# Patient Record
Sex: Female | Born: 1935 | Race: White | Hispanic: No | Marital: Married | Smoking: Former smoker
Health system: Southern US, Community
[De-identification: ages and names within clinical notes are randomized; demographics above are authoritative.]

## PROBLEM LIST (undated history)

## (undated) ENCOUNTER — Ambulatory Visit (HOSPITAL_COMMUNITY): Payer: Medicare Other

## (undated) DIAGNOSIS — I10 Essential (primary) hypertension: Secondary | ICD-10-CM

## (undated) DIAGNOSIS — F32A Depression, unspecified: Secondary | ICD-10-CM

## (undated) DIAGNOSIS — F0390 Unspecified dementia without behavioral disturbance: Secondary | ICD-10-CM

## (undated) DIAGNOSIS — J449 Chronic obstructive pulmonary disease, unspecified: Secondary | ICD-10-CM

## (undated) DIAGNOSIS — F329 Major depressive disorder, single episode, unspecified: Secondary | ICD-10-CM

## (undated) DIAGNOSIS — E079 Disorder of thyroid, unspecified: Secondary | ICD-10-CM

## (undated) HISTORY — DX: Chronic obstructive pulmonary disease, unspecified: J44.9

## (undated) HISTORY — DX: Major depressive disorder, single episode, unspecified: F32.9

## (undated) HISTORY — DX: Depression, unspecified: F32.A

## (undated) HISTORY — DX: Unspecified dementia, unspecified severity, without behavioral disturbance, psychotic disturbance, mood disturbance, and anxiety: F03.90

## (undated) HISTORY — DX: Disorder of thyroid, unspecified: E07.9

## (undated) HISTORY — DX: Essential (primary) hypertension: I10

## (undated) HISTORY — PX: LUMBAR DISC SURGERY: SHX700

## (undated) HISTORY — PX: ROTATOR CUFF REPAIR: SHX139

## (undated) HISTORY — PX: CATARACT EXTRACTION: SUR2

---

## 2004-02-26 ENCOUNTER — Emergency Department: Payer: Self-pay | Admitting: General Practice

## 2004-07-29 ENCOUNTER — Ambulatory Visit: Payer: Self-pay | Admitting: Orthopaedic Surgery

## 2004-09-27 ENCOUNTER — Other Ambulatory Visit: Payer: Self-pay

## 2004-09-27 ENCOUNTER — Ambulatory Visit: Payer: Self-pay | Admitting: Orthopaedic Surgery

## 2004-10-07 ENCOUNTER — Ambulatory Visit: Payer: Self-pay | Admitting: Orthopaedic Surgery

## 2005-07-15 ENCOUNTER — Ambulatory Visit: Payer: Self-pay | Admitting: Unknown Physician Specialty

## 2005-07-24 ENCOUNTER — Ambulatory Visit: Payer: Self-pay | Admitting: Orthopaedic Surgery

## 2005-07-25 ENCOUNTER — Ambulatory Visit: Payer: Self-pay | Admitting: Orthopaedic Surgery

## 2006-07-16 ENCOUNTER — Ambulatory Visit: Payer: Self-pay | Admitting: Internal Medicine

## 2006-11-13 ENCOUNTER — Other Ambulatory Visit: Payer: Self-pay

## 2006-11-13 ENCOUNTER — Inpatient Hospital Stay: Payer: Self-pay | Admitting: Surgery

## 2008-02-03 ENCOUNTER — Ambulatory Visit: Payer: Self-pay | Admitting: Neurology

## 2008-11-06 ENCOUNTER — Ambulatory Visit: Payer: Self-pay | Admitting: Gastroenterology

## 2009-03-29 ENCOUNTER — Ambulatory Visit: Payer: Self-pay | Admitting: Internal Medicine

## 2009-11-23 ENCOUNTER — Ambulatory Visit: Payer: Self-pay | Admitting: Internal Medicine

## 2010-04-01 ENCOUNTER — Emergency Department: Payer: Self-pay | Admitting: Emergency Medicine

## 2010-04-19 ENCOUNTER — Other Ambulatory Visit (HOSPITAL_COMMUNITY): Payer: Self-pay | Admitting: Neurosurgery

## 2010-04-19 ENCOUNTER — Ambulatory Visit (HOSPITAL_COMMUNITY)
Admission: RE | Admit: 2010-04-19 | Discharge: 2010-04-19 | Disposition: A | Discharge status: Home / Self Care | Payer: Medicare Other | Source: Ambulatory Visit | Attending: Neurosurgery | Admitting: Neurosurgery

## 2010-04-19 ENCOUNTER — Encounter (HOSPITAL_COMMUNITY)
Admission: RE | Admit: 2010-04-19 | Discharge: 2010-04-19 | Disposition: A | Discharge status: Home / Self Care | Payer: Medicare Other | Source: Ambulatory Visit | Attending: Neurosurgery | Admitting: Neurosurgery

## 2010-04-19 DIAGNOSIS — Z01812 Encounter for preprocedural laboratory examination: Secondary | ICD-10-CM | POA: Insufficient documentation

## 2010-04-19 DIAGNOSIS — Z01818 Encounter for other preprocedural examination: Secondary | ICD-10-CM | POA: Insufficient documentation

## 2010-04-19 DIAGNOSIS — M5136 Other intervertebral disc degeneration, lumbar region: Secondary | ICD-10-CM

## 2010-04-19 DIAGNOSIS — M47814 Spondylosis without myelopathy or radiculopathy, thoracic region: Secondary | ICD-10-CM | POA: Insufficient documentation

## 2010-04-19 DIAGNOSIS — M5137 Other intervertebral disc degeneration, lumbosacral region: Secondary | ICD-10-CM | POA: Insufficient documentation

## 2010-04-19 DIAGNOSIS — Z01810 Encounter for preprocedural cardiovascular examination: Secondary | ICD-10-CM | POA: Insufficient documentation

## 2010-04-19 DIAGNOSIS — M51379 Other intervertebral disc degeneration, lumbosacral region without mention of lumbar back pain or lower extremity pain: Secondary | ICD-10-CM | POA: Insufficient documentation

## 2010-04-19 LAB — BASIC METABOLIC PANEL
BUN: 18 mg/dL (ref 6–23)
CO2: 26 mEq/L (ref 19–32)
Chloride: 99 mEq/L (ref 96–112)
Creatinine, Ser: 0.99 mg/dL (ref 0.4–1.2)
Glucose, Bld: 104 mg/dL — ABNORMAL HIGH (ref 70–99)
Potassium: 4.2 mEq/L (ref 3.5–5.1)

## 2010-04-19 LAB — URINALYSIS, ROUTINE W REFLEX MICROSCOPIC
Glucose, UA: NEGATIVE mg/dL
Nitrite: NEGATIVE
Specific Gravity, Urine: 1.022 (ref 1.005–1.030)
pH: 5 (ref 5.0–8.0)

## 2010-04-19 LAB — SURGICAL PCR SCREEN: Staphylococcus aureus: NEGATIVE

## 2010-04-19 LAB — URINE MICROSCOPIC-ADD ON

## 2010-04-19 LAB — APTT: aPTT: 24 seconds (ref 24–37)

## 2010-04-19 LAB — CBC
HCT: 44.9 % (ref 36.0–46.0)
MCH: 30.6 pg (ref 26.0–34.0)
MCV: 89.1 fL (ref 78.0–100.0)
Platelets: 252 10*3/uL (ref 150–400)
RDW: 14.7 % (ref 11.5–15.5)
WBC: 14.7 10*3/uL — ABNORMAL HIGH (ref 4.0–10.5)

## 2010-04-22 ENCOUNTER — Inpatient Hospital Stay (HOSPITAL_COMMUNITY): Payer: Medicare Other

## 2010-04-22 ENCOUNTER — Inpatient Hospital Stay (HOSPITAL_COMMUNITY)
Admission: RE | Admit: 2010-04-22 | Discharge: 2010-04-23 | DRG: 491 | Disposition: A | Discharge status: Home / Self Care | Payer: Medicare Other | Source: Ambulatory Visit | Attending: Neurosurgery | Admitting: Neurosurgery

## 2010-04-22 ENCOUNTER — Other Ambulatory Visit: Payer: Self-pay | Admitting: Neurosurgery

## 2010-04-22 DIAGNOSIS — M47817 Spondylosis without myelopathy or radiculopathy, lumbosacral region: Principal | ICD-10-CM | POA: Diagnosis present

## 2010-04-22 DIAGNOSIS — I10 Essential (primary) hypertension: Secondary | ICD-10-CM | POA: Diagnosis present

## 2010-04-22 DIAGNOSIS — M1710 Unilateral primary osteoarthritis, unspecified knee: Secondary | ICD-10-CM | POA: Diagnosis present

## 2010-04-22 DIAGNOSIS — M7130 Other bursal cyst, unspecified site: Secondary | ICD-10-CM | POA: Diagnosis present

## 2010-04-26 NOTE — Op Note (Signed)
NAME:  Belinda Gonzalez, Belinda Gonzalez               ACCOUNT NO.:  [id number]  MEDICAL RECORD NO.:  [id number]           PATIENT TYPE:  I  LOCATION:  3534                         FACILITY:  [facility]  PHYSICIAN:  Clydene Fake, M.D.  DATE OF BIRTH:  07/13/1934  DATE OF PROCEDURE: DATE OF DISCHARGE:                              OPERATIVE REPORT   PREOPERATIVE DIAGNOSIS:  Lumbar stenosis, spondylosis, right-sided radiculopathy, possible synovial cyst.  POSTOPERATIVE DIAGNOSIS:  Lumbar stenosis, spondylosis, right-sided radiculopathy, possible synovial cyst.  PROCEDURE:  Right decompressive laminectomy decompressing the L3, L4, L5 roots (three levels), microdissection with microscope.  SURGEON:  Clydene Fake, MD  ASSISTANT:  Clydene Fake, MD  General endotracheal tube anesthesia.  ESTIMATED BLOOD LOSS:  Minimal.  BLOOD GIVEN:  None.  DRAINS:  None.  COMPLICATIONS:  None.  PATHOLOGY:  __________ was sent for permanent section.  PROCEDURE:  The patient is a 76 year old woman who has had back and right leg pain radiating to the right hip and then to her anterior thigh, and sometimes the shin area.  Epidural injection was done which gave a day or two of relief but then symptoms came back.  MRI prior to that showed multilevel spondylitic change of the lumbar spine and facet hypertrophy and which was worse on the right side at 3-4 and 4-5 with lateral recess stenosis at those two levels, probable synovial cyst at the 3-4 level.  The patient was brought in for decompression of L3 through 5.  PROCEDURE IN DETAIL:  The patient was brought into the operating room, general anesthesia was induced.  The patient was placed in a prone position on Wilson frame and all pressure points were padded and the patient was prepared and draped in sterile fashion.  A needle was placed in the interspace.  X-rays were obtained showing the needle was pointed towards the 4-5 interspace.  We injected the area  of incision with 20 mL of 1% lidocaine with epinephrine.  Incision was then made over the midline in the lower lumbar spine.  Incision was taken down to the fascia.  Hemostasis was obtained with Bovie cauterization, and the right- side subperiosteal dissection was done over the spinous process of lamina at the two levels.  Markers were placed at two spaces and x-rays were obtained showing the markers were at the 2-3 and 3-4 levels, so we dissected down the 4-5 level, moved our retractors down exposing the 3-4 and 4-5 levels.  I placed markers at the interspaces of 2-3, 3-4, and 4- 5.  I took another x-ray and this confirmed our positioning.  Using a high-speed drill, I started decompressing the right-sided hemilaminectomy removing the bottom of L4 and the top of L5 spinous lamina and medial facets.  Microscope was brought in for microdissection.  Kerrison punches were used to continue the decompression.  There was significant thickened ligament at the 3-4 level.  We did find epidural mass that was attached to the dura. Careful microdissection was used to try to mobilize this and couple spots was very adherent to the dura and so we ended up opening up cyst, insides of  the dark material, and pieces of this and pieces of the wall were sent to pathology for permanent sections.  We removed most of the cyst and there was other areas of adherent ligament into the dura and we carefully dissected that off.  Foraminotomy done over the L3, L4, L5 roots.  When we had finished, we had good decompression and good hemilaminectomy from L3 to 5.  We did Valsalva.  No sign of CSF leak. We irrigated with antibiotic solution.  We had very good hemostasis. Retractors were removed, and the fascia closed with 0 Vicryl interrupted sutures, subcutaneous tissue closed with 2-0 and 3-0 Vicryl interrupted sutures, skin closed with benzoin and Steri-Strips.  Dressing was placed.  The patient was placed back in the  supine position, awoken from anesthesia, and transferred to recovery room in stable condition.          ______________________________ Clydene Fake, M.D.     JRH/MEDQ  D:  04/22/2010  T:  04/23/2010  Job:  [id number]  Electronically Signed by Colon Branch M.D. on 04/26/2010 04:44:44 PM

## 2010-07-19 ENCOUNTER — Ambulatory Visit: Payer: Self-pay | Admitting: Internal Medicine

## 2010-08-12 ENCOUNTER — Emergency Department: Payer: Self-pay | Admitting: *Deleted

## 2010-08-18 ENCOUNTER — Ambulatory Visit: Payer: Self-pay | Admitting: Family Medicine

## 2011-04-24 ENCOUNTER — Ambulatory Visit: Payer: Self-pay | Admitting: Physical Medicine and Rehabilitation

## 2012-03-01 LAB — CBC
HCT: 38.4 % (ref 35.0–47.0)
HGB: 12.7 g/dL (ref 12.0–16.0)
MCHC: 33.1 g/dL (ref 32.0–36.0)
RBC: 4.26 10*6/uL (ref 3.80–5.20)
WBC: 9.8 10*3/uL (ref 3.6–11.0)

## 2012-03-01 LAB — URINALYSIS, COMPLETE
Bacteria: NONE SEEN
Bilirubin,UR: NEGATIVE
Protein: NEGATIVE
WBC UR: 8 /HPF (ref 0–5)

## 2012-03-01 LAB — COMPREHENSIVE METABOLIC PANEL
EGFR (Non-African Amer.): 53 — ABNORMAL LOW
Glucose: 99 mg/dL (ref 65–99)
Osmolality: 274 (ref 275–301)
Sodium: 137 mmol/L (ref 136–145)
Total Protein: 7.2 g/dL (ref 6.4–8.2)

## 2012-03-01 LAB — TROPONIN I: Troponin-I: <0.02 ng/mL

## 2012-03-02 ENCOUNTER — Inpatient Hospital Stay: Payer: Self-pay | Admitting: Internal Medicine

## 2012-03-02 LAB — SEDIMENTATION RATE: Erythrocyte Sed Rate: 13 mm/hr (ref 0–30)

## 2012-03-02 LAB — TSH: Thyroid Stimulating Horm: 2.54 u[IU]/mL

## 2012-03-03 LAB — URINE CULTURE

## 2012-05-20 ENCOUNTER — Ambulatory Visit: Payer: Self-pay | Admitting: Family Medicine

## 2012-08-21 ENCOUNTER — Ambulatory Visit: Payer: Self-pay | Admitting: Psychiatry

## 2012-08-21 LAB — CREATININE, SERUM
Creatinine: 0.8 mg/dL (ref 0.60–1.30)
EGFR (African American): >60

## 2013-01-31 ENCOUNTER — Ambulatory Visit: Payer: Self-pay | Admitting: Gastroenterology

## 2013-02-04 ENCOUNTER — Ambulatory Visit: Payer: Self-pay | Admitting: Gastroenterology

## 2013-02-09 LAB — PATHOLOGY REPORT

## 2013-06-28 ENCOUNTER — Ambulatory Visit: Payer: Self-pay | Admitting: Family Medicine

## 2013-10-17 DIAGNOSIS — M48061 Spinal stenosis, lumbar region without neurogenic claudication: Secondary | ICD-10-CM | POA: Diagnosis present

## 2014-01-03 ENCOUNTER — Ambulatory Visit: Payer: Self-pay | Admitting: Physical Medicine and Rehabilitation

## 2014-04-28 NOTE — H&P (Signed)
PATIENT NAME:  Belinda Gonzalez, Belinda Gonzalez MR#:  [id number] DATE OF BIRTH:  08-05-1934  DATE OF ADMISSION:  03/02/2012  PRIMARY CARE PHYSICIAN:  Dr. Burnadette PopLinthavong.   CHIEF COMPLAINT:  Acute onset of confusion.   HISTORY OF PRESENTING ILLNESS:  A 80 year old Caucasian female patient with a history of hypertension, dementia, presented to the Emergency Room, brought in by her husband with acute worsening of confusion. The patient did not know where she was or recognized the  people around, and she was inappropriately dressed. In the Emergency Room the patient is not oriented to place or time; is oriented to person. Has fever of 100.4 with a UA normal. Chest x-ray showing no infiltrate. No rash. No clear source of infection. An LP was attempted in the Emergency Room; was not successful.   Husband mentioned that the patient has had a slow decline of her mentation over the past few months in a stepwise pattern.   PAST MEDICAL HISTORY:  Of hypothyroidism, hyperlipidemia, hypertension, depression, COPD.  PAST SURGICAL HISTORY:   Cesarean section.   HOME MEDICATIONS: 1. Synthroid 100 mcg oral daily.  2. Remeron 45 mg oral daily.  3. Lisinopril 20 mg oral 2 times a day.  4. Crestor 10 mg oral daily.  5. Tramadol 50 mg oral every 6 hours as needed for pain.  6. Duloxetine 30 mg oral daily.  7. Gabapentin 300 mg oral daily.   8. Vitamin D2 at 50,000 international units oral once a week.   ALLERGIES: No known drug allergies.   SOCIAL HISTORY:  The patient quit smoking 2 years back. No alcohol. Is married. Three children, and lives with her husband.   FAMILY HISTORY:  Positive for diabetes in her father. Cancer in 2 grandfathers. Also positive for lung and stomach cancers.   Review of systems unobtainable as patient is confused.   PHYSICAL EXAMINATION: VITAL SIGNS:  Shows temperature 100.4; presently down to 98.2, pulse of 74, respirations 18, blood pressure 123/71, saturating 95% on room air.  GENERAL:   Elderly Caucasian female patient, lying in bed, comfortable, conversational.  PSYCHIATRIC:  Alert and awake, but not oriented to place or time. Oriented to person. Confused.   HEENT:  Atraumatic, normocephalic. Oral mucosa dry and pink. External ears and nose normal. No pallor. No icterus. Pupils were bilaterally equal and reactive to light.  NECK:  Supple. No thyromegaly. No palpable lymph nodes. Trachea midline. No carotid bruit, JVD.  CARDIOVASCULAR:  S1, S2, regular rate and rhythm. No edema. Peripheral pulses 2+.  RESPIRATORY:  Normal work of breathing. Clear to auscultation on both sides.  GASTROINTESTINAL:  Soft abdomen, nontender. Bowel sounds present. No hepatosplenomegaly palpable.  SKIN:  Warm and dry. No petechiae or cyanosis. MUSCULOSKELETAL:  No joint swelling, redness, or effusion of the large joints. Normal muscle tone.   NEUROLOGICAL:  Motor strength 5/5 in upper and lower extremities. Sensation to fine touch intact all over. No neck rigidity. Kernig sign negative.   LAB STUDIES:  Show glucose 99, BUN 12, creatinine 1.04. Troponin less than 0.02. TSH of 2.54. WBC 9.8, hemoglobin 12.7.   Urinalysis shows 8 WBC and no bacteria.   CT scan of the head shows no acute changes.   Chest x-ray shows no acute infiltrate.   ASSESSMENT AND PLAN: 1. Acute encephalopathy, with fever:  There is no clear source of the fever at this time. Urinalysis shows no urinary tract infection, and chest x-ray shows no infiltrate. A lumbar puncture was attempted in the Emergency Room  without any neck rigidity. Elevated WBC. Meningoencephalitis would be unlikely. Will start the patient on ceftriaxone at this time; await culture results. If the patient does spike a fever will need a lumbar puncture with interventional radiology, and antibiotics will be broadened.  2. Dementia:  From what her husband mentions this patient seems to have a vascular dementia, with a stepwise decline over the past few months.  Presently no focal neurological deficits other than the confusion. CT scan of the head shows no acute stroke. The patient is on aspirin and statin.  3. Hypertension, well-controlled:  Continue medications.  4. Deep vein thrombosis prophylaxis with heparin.   CODE STATUS:  FULL CODE.   Time spent today on this case was greater than 75 minutes, with more than 50% spent in coordination of care.    ____________________________ Belinda Gonzalez. Belinda Housholder, MD srs:dm D: 03/02/2012 04:58:00 ET Gonzalez: 03/02/2012 09:18:38 ET JOB#: [id number]  cc: Belinda Ivan, MD Belinda Gonzalez R. Belinda Jerde, MD, <Dictator>  Orie Fisherman MD ELECTRONICALLY SIGNED 03/06/2012 15:25

## 2015-09-20 ENCOUNTER — Ambulatory Visit: Payer: Medicare Other | Attending: Neurology | Admitting: Speech Pathology

## 2015-09-20 DIAGNOSIS — R41841 Cognitive communication deficit: Secondary | ICD-10-CM | POA: Diagnosis present

## 2015-09-21 ENCOUNTER — Encounter: Payer: Self-pay | Admitting: Speech Pathology

## 2015-09-21 NOTE — Therapy (Signed)
[place] [facility] [address] [place], [place], [address] Phone: [phone]   Fax:  [phone]  Speech Language Pathology Evaluation  Patient Details  Name: Belinda SkeetersClara T Zucco MRN: [id number] Date of Birth: [birth year] Referring Provider: Dr. Malvin JohnsPotter  Encounter Date: 09/20/2015      End of Session - 09/21/15 1100    Visit Number 1   Number of Visits 9   Date for SLP Re-Evaluation 10/26/15   SLP Start Time 1300   SLP Stop Time  1410   SLP Time Calculation (min) 70 min   Activity Tolerance Patient tolerated treatment well      Past Medical History:  Diagnosis Date  . COPD (chronic obstructive pulmonary disease) (HCC)   . Depression   . Hypertension   . Thyroid disease     History reviewed. No pertinent surgical history.  There were no vitals filed for this visit.          SLP Evaluation [facility] - 09/21/15 0001      SLP Visit Information   SLP Received On 09/20/15   Referring Provider Dr. Malvin JohnsPotter   Onset Date 08/23/2015   Medical Diagnosis Moderate dementia     Subjective   Subjective The patient and her husband are not certain what speech therapy can offer.     General Information   HPI 81 year old woman with moderate dementia referred for cognitive therapy.  She is accompanied by her husband.     Prior Functional Status   Cognitive/Linguistic Baseline Baseline deficits   Baseline deficit details Moderate dementia     Cognition   Overall Cognitive Status History of cognitive impairments - at baseline     Auditory Comprehension   Overall Auditory Comprehension Appears within functional limits for tasks assessed  For simple/concrete contexts     Reading Comprehension   Reading Status Impaired  Not able to process longer/abstract information     Expression   Primary Mode of Expression Verbal     Verbal Expression   Overall Verbal Expression Appears within functional limits for tasks assessed  Not able  to express longer/abstract information     Written Expression   Dominant Hand Left   Written Expression Within Functional Limits   Overall Writen Expression Functional for concrete      Oral Motor/Sensory Function   Overall Oral Motor/Sensory Function Appears within functional limits for tasks assessed     Motor Speech   Overall Motor Speech Appears within functional limits for tasks assessed     Standardized Assessments   Standardized Assessments  Western Aphasia Battery revised;Other Assessment  Montreal Cognitive Assessment (MOCA)       Western Aphasia Battery- Screening  Spontaneous Speech      Information content   7/10       Fluency    10/10     Comprehension     Yes/No questions   10/10          Sequential Commands  10/10    Repetition    10/10     Naming    Object Naming   10/10       Screening Aphasia Quotient 95/100  Reading    8/10 Writing    8/10 Screening Language Quotient 91/100  Montreal Cognitive Assessment (MOCA) Version: 7.1 Visuospatieal/Executive Alternating trail making       1/1 Visuoconstruction Skills (copy 3-d design) 0/1 Draw a clock     2/3 Naming     3/3  Attention Forward digit span    1/1 Backward digit span    1/1 Vigilance     1/1 Serial 7's     1/3 Language  Verbal Fluency     0/1 Repetition     2/2 Abstraction     0/2 Delayed Recall    0/5 Orientation     1/6 TOTAL      13/30   Dementia Functional Impact Questionnaire  1. Social isolation: The patient is no longer initiating participation in social activities and is actively avoiding activities.  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  The patient does not indicate a desire to increase participation at any level. 2. Dignity/independence: The patient does not indicate concern regarding lack of independence. 3. Remembering names and other important information: The patient and her husband do not feel that this is an issue for them. 4. Schedule management: The husband states he  manages her schedule. 5. Managing medications: The husband states that a granddaughter organizes their medication once a week. 6. Misplacing important items: The patient denies this is a problem. 7. Using a computer, cell phone, or TV remote: The patient is no longer independent in using the TV remote.  Her husband states that he has tried various methods to simplify the task for her. 8. Participation in volunteer work or other meaningful activities: The patient is sleeping most of the day, does not have responsibilities at home, and resists going out to functions.  What we talked about: 1. [facility] for activities or groups that may interest the patient. 2. Try to set a routine vs. letting the patient sleep all day. 3. Increase responsibilities at home- fold laundry, put the dishes away. 4. Try writing a check list for activities that she is not doing well- such as routine/daily bathing. 5. Advertising account planner [facility] for ideas. 6. Return in 2 weeks.             SLP Education - 09/21/15 1059    Education provided Yes   Education Details Role of SLP in cognitive therapy   Person(s) Educated Patient;Spouse   Methods Explanation   Comprehension Verbalized understanding            SLP Long Term Goals - 09/21/15 1103      SLP LONG TERM GOAL #1   Title Patient will identify cognitive barriers and participate in developing functional compensatory strategies.   Time 8   Period Weeks   Status New     SLP LONG TERM GOAL #2   Title Patient will demonstrate functional use of external memory aids with 80% accuracy within structured setting.   Time 8   Period Weeks   Status New     SLP LONG TERM GOAL #3   Title Patient will complete attention and memory strategy activities with 80% accuracy.   Time 8   Period Weeks          Plan - 09/21/15 1101    Clinical Impression Statement This 81 year ole woman with moderate dementia, is presenting with moderate  cognitive-communication deficits characterized by short term memory loss, impaired sustained attention, impaired executive skills, reduced abstract thought, and decreased participation in social or household activities.  The patient demonstrates very strong language skills for using compensatory strategies. The patient was accompanied by her spouse who is her primary caregiver (with some help from a granddaughter).  The patient and her husband participated in discussion regarding current cognitive barriers to independence/participation.  We  generated multiple ideas to implement.  The patient and her husband will return in 2 week to assess efficacy of ideas and continue generating compensatory strategies.   Speech Therapy Frequency 1x /week   Duration Other (comment)  8 weeks   Potential to Achieve Goals Good   Potential Considerations Ability to learn/carryover information;Cooperation/participation level;Previous level of function;Severity of impairments;Family/community support   SLP Home Exercise Plan Patient and spouse given several ideas to implement   Consulted and Agree with Plan of Care Patient;Family member/caregiver   Family Member Consulted Spouse      Patient will benefit from skilled therapeutic intervention in order to improve the following deficits and impairments:   Cognitive communication deficit - Plan: SLP plan of care cert/re-cert      G-Codes - 23-Sep-2015 1105    Functional Assessment Tool Used MOCA, WAB-screeing, clinical judgment   Functional Limitations Attention   Attention Current Status (U9811) At least 40 percent but less than 60 percent impaired, limited or restricted   Attention Goal Status (B1478) At least 20 percent but less than 40 percent impaired, limited or restricted      Problem List There are no active problems to display for this patient.  Dollene Primrose, MS/CCC- SLP  Leandrew Koyanagi 09-23-15, 11:08 AM  [facility] [facility] MAIN [facility] SERVICES [address] [place], [place], [address] Phone: [phone]   Fax:  [phone]  Name: DANESE DORSAINVIL MRN: [id number] Date of Birth: [birth year]

## 2015-09-25 ENCOUNTER — Ambulatory Visit: Payer: Medicare Other | Admitting: Speech Pathology

## 2015-09-27 ENCOUNTER — Encounter: Payer: Medicare Other | Admitting: Speech Pathology

## 2015-10-01 ENCOUNTER — Encounter: Payer: Medicare Other | Admitting: Speech Pathology

## 2015-10-02 ENCOUNTER — Ambulatory Visit: Payer: Medicare Other | Admitting: Speech Pathology

## 2015-10-03 ENCOUNTER — Encounter: Payer: Medicare Other | Admitting: Speech Pathology

## 2015-10-09 ENCOUNTER — Ambulatory Visit: Payer: Medicare Other | Attending: Neurology | Admitting: Speech Pathology

## 2015-10-09 DIAGNOSIS — R41841 Cognitive communication deficit: Secondary | ICD-10-CM | POA: Insufficient documentation

## 2015-10-10 ENCOUNTER — Encounter: Payer: Self-pay | Admitting: Speech Pathology

## 2015-10-10 NOTE — Therapy (Signed)
[facility] [facility] [address] [place], [place], [address] Phone: [phone]   Fax:  [phone]  Speech Language Pathology Treatment  Patient Details  Name: Belinda Gonzalez MRN: [id number] Date of Birth: 1934-04-11 Referring Provider: Dr. Malvin JohnsPotter  Encounter Date: 10/09/2015      End of Session - 10/10/15 1437    Visit Number 2   Number of Visits 9   Date for SLP Re-Evaluation 10/26/15   SLP Start Time 1400   SLP Stop Time  1500   SLP Time Calculation (min) 60 min   Activity Tolerance Patient tolerated treatment well      Past Medical History:  Diagnosis Date  . COPD (chronic obstructive pulmonary disease) (HCC)   . Depression   . Hypertension   . Thyroid disease     History reviewed. No pertinent surgical history.  There were no vitals filed for this visit.      Subjective Assessment - 10/10/15 1436    Subjective "I don't want any responsibilities"  "I haven't been doing anything"    Patient is accompained by: Family member               ADULT SLP TREATMENT - 10/10/15 0001      General Information   Behavior/Cognition Alert;Cooperative;Pleasant mood;Confused;Decreased sustained attention;Other (comment)  Moderate dementia     Treatment Provided   Treatment provided Cognitive-Linquistic     Pain Assessment   Pain Assessment No/denies pain     Cognitive-Linquistic Treatment   Treatment focused on Cognition;Patient/family/caregiver education   Skilled Treatment Cognitive barriers:  the patient is not able to actively participate in identifying cognitive barriers to improved participation and independence.  The patient's husband is able to identify and verbalize barriers, but is not able to problem solve independently.  We discussed contacting [facility] for further available services, such as an adult day care program.     Assessment / Recommendations / Plan   Plan Continue with current plan of care      Progression Toward Goals   Progression toward goals Progressing toward goals          SLP Education - 10/10/15 1436    Education provided Yes   Education Details The patient is not likely to independently engage in activities   Person(s) Educated Patient;Spouse   Methods Explanation   Comprehension Verbalized understanding            SLP Long Term Goals - 09/21/15 1103      SLP LONG TERM GOAL #1   Title Patient will identify cognitive barriers and participate in developing functional compensatory strategies.   Time 8   Period Weeks   Status New     SLP LONG TERM GOAL #2   Title Patient will demonstrate functional use of external memory aids with 80% accuracy within structured setting.   Time 8   Period Weeks   Status New     SLP LONG TERM GOAL #3   Title Patient will complete attention and memory strategy activities with 80% accuracy.   Time 8   Period Weeks          Plan - 10/10/15 1438    Clinical Impression Statement The patient's primary cognitive barrier to participation and independence is lack of personal motivation.  There is probably an element of unwillingness to face her own cognitive decline.  I do not think that the patient can be expected to independently engage in activities that she once  enjoyed.  I have strongly suggested that the patient's family contact [facility] to find programs that are suitable for her needs.  In the meantime, SLP will explore activities that the patient may enjoy and determine the level of support required to participate.   Speech Therapy Frequency 1x /week   Duration Other (comment)   Potential to Achieve Goals Good   Potential Considerations Ability to learn/carryover information;Cooperation/participation level;Previous level of function;Severity of impairments;Family/community support   SLP Home Exercise Plan Recommend contacting [employer]   Consulted and Agree with Plan of Care Patient;Family member/caregiver    Family Member Consulted Spouse      Patient will benefit from skilled therapeutic intervention in order to improve the following deficits and impairments:   Cognitive communication deficit    Problem List There are no active problems to display for this patient.  Dollene Primrose, MS/CCC- SLP  Leandrew Koyanagi 10/10/2015, 2:40 PM  [place] [facility] MAIN [facility] SERVICES [address] [place], [place], [address] Phone: [phone]   Fax:  [phone]   Name: Belinda Gonzalez MRN: [id number] Date of Birth: October 28, 1934

## 2015-10-11 ENCOUNTER — Ambulatory Visit: Payer: Medicare Other | Admitting: Speech Pathology

## 2015-10-16 ENCOUNTER — Ambulatory Visit: Payer: Medicare Other | Admitting: Speech Pathology

## 2015-10-16 DIAGNOSIS — R41841 Cognitive communication deficit: Secondary | ICD-10-CM

## 2015-10-17 ENCOUNTER — Encounter: Payer: Self-pay | Admitting: Speech Pathology

## 2015-10-17 NOTE — Therapy (Signed)
[facility] [address] [place], [place], [address] Phone: [phone]   Fax:  [phone]  Speech Language Pathology Treatment  Patient Details  Name: Belinda SkeetersClara T Gonzalez MRN: [id number] Date of Birth: 01/15/1934 Referring Provider: Dr. Malvin JohnsPotter  Encounter Date: 10/16/2015      End of Session - 10/17/15 1022    Visit Number 3   Number of Visits 9   Date for SLP Re-Evaluation 10/26/15   SLP Start Time 1400   SLP Stop Time  1500   SLP Time Calculation (min) 60 min      Past Medical History:  Diagnosis Date  . COPD (chronic obstructive pulmonary disease) (HCC)   . Depression   . Hypertension   . Thyroid disease     History reviewed. No pertinent surgical history.  There were no vitals filed for this visit.      Subjective Assessment - 10/17/15 1003    Subjective "I don't want any responsibilities"  "I haven't been doing anything"    Patient is accompained by: Family member               ADULT SLP TREATMENT - 10/17/15 0001      General Information   Behavior/Cognition Alert;Cooperative;Pleasant mood;Confused;Decreased sustained attention;Other (comment)  Moderate dementia     Treatment Provided   Treatment provided Cognitive-Linquistic     Pain Assessment   Pain Assessment No/denies pain     Cognitive-Linquistic Treatment   Treatment focused on Cognition;Patient/family/caregiver education   Skilled Treatment Cognitively stimulating activities: The patient and SLP completed a variety of cognitive activities.  The patient requires the most support for visual perceptual activities (find hidden pictures, spot the differences) and least support for simple reading activities.       Assessment / Recommendations / Plan   Plan Continue with current plan of care     Progression Toward Goals   Progression toward goals Progressing toward goals          SLP Education - 10/17/15 1003    Education  provided Yes   Education Details Patient requires support to participate in activities   Person(s) Educated Patient;Spouse   Methods Explanation   Comprehension Verbalized understanding            SLP Long Term Goals - 09/21/15 1103      SLP LONG TERM GOAL #1   Title Patient will identify cognitive barriers and participate in developing functional compensatory strategies.   Time 8   Period Weeks   Status New     SLP LONG TERM GOAL #2   Title Patient will demonstrate functional use of external memory aids with 80% accuracy within structured setting.   Time 8   Period Weeks   Status New     SLP LONG TERM GOAL #3   Title Patient will complete attention and memory strategy activities with 80% accuracy.   Time 8   Period Weeks          Plan - 10/17/15 1023    Clinical Impression Statement The patient's primary cognitive barrier to participation and independence is lack of personal motivation.  There is probably an element of unwillingness to face her own cognitive decline.  I do not think that the patient can be expected to independently engage in activities that she once enjoyed.  The patient and SLP completed a variety of cognitive activities.  The patient requires the most support for visual perceptual activities (find hidden pictures, spot  the differences) and least support for simple reading activities.  I continue to strongly suggest that the patient's family contact [employer] to find programs that are suitable for her needs.  In the meantime, SLP will continue explore activities that the patient may enjoy and determine the level of support required to participate.   Speech Therapy Frequency 1x /week   Duration Other (comment)   Treatment/Interventions Patient/family education;Cognitive reorganization   Potential to Achieve Goals Good   Potential Considerations Ability to learn/carryover information;Cooperation/participation level;Previous level of function;Severity of  impairments;Family/community support   SLP Home Exercise Plan Recommend contacting [employer], patient and spouse given written list of suggested activities   Consulted and Agree with Plan of Care Patient;Family member/caregiver   Family Member Consulted Spouse      Patient will benefit from skilled therapeutic intervention in order to improve the following deficits and impairments:   Cognitive communication deficit    Problem List There are no active problems to display for this patient.  Dollene Primrose, MS/CCC- SLP  Leandrew Koyanagi 10/17/2015, 10:24 AM  [facility] MAIN [facility] SERVICES [address] [place], [place], [address] Phone: [phone]   Fax:  [phone]   Name: Belinda Gonzalez MRN: [id number] Date of Birth: 1934/12/02

## 2015-10-18 ENCOUNTER — Encounter: Payer: Medicare Other | Admitting: Speech Pathology

## 2015-10-26 ENCOUNTER — Encounter: Payer: Self-pay | Admitting: Speech Pathology

## 2015-10-26 ENCOUNTER — Ambulatory Visit: Payer: Medicare Other | Admitting: Speech Pathology

## 2015-10-26 DIAGNOSIS — R41841 Cognitive communication deficit: Secondary | ICD-10-CM | POA: Diagnosis not present

## 2015-10-26 NOTE — Therapy (Signed)
[facility] [address] [place], [place], [address] Phone: [phone]   Fax:  [phone]  Speech Language Pathology Treatment  Patient Details  Name: Belinda SkeetersClara T Gonzalez MRN: [id number] Date of Birth: [birth year] Referring Provider: Dr. Malvin JohnsPotter  Encounter Date: 10/26/2015      End of Session - 10/26/15 1252    Visit Number 4   Number of Visits 9   Date for SLP Re-Evaluation 11/26/15   SLP Start Time 1000   SLP Stop Time  1100   SLP Time Calculation (min) 60 min      Past Medical History:  Diagnosis Date  . COPD (chronic obstructive pulmonary disease) (HCC)   . Depression   . Hypertension   . Thyroid disease     History reviewed. No pertinent surgical history.  There were no vitals filed for this visit.      Subjective Assessment - 10/26/15 1252    Subjective "I don't want any responsibilities"  "I haven't been doing anything"    Patient is accompained by: Family member               ADULT SLP TREATMENT - 10/26/15 0001      General Information   Behavior/Cognition Alert;Cooperative;Pleasant mood;Confused;Decreased sustained attention;Other (comment)  Moderate dementia     Treatment Provided   Treatment provided Cognitive-Linquistic     Pain Assessment   Pain Assessment No/denies pain     Cognitive-Linquistic Treatment   Treatment focused on Cognition;Patient/family/caregiver education   Skilled Treatment Cognitively stimulating activities: The patient and SLP completed a variety of cognitive activities.  Activities included: dot-to-dot, color by number, telling time, stating location of list of items, word search.  The patient requires the most support for visual perceptual activities (find hidden pictures, spot the differences) and least support for simple reading activities.       Assessment / Recommendations / Plan   Plan Continue with current plan of care     Progression Toward Goals   Progression toward goals Progressing toward goals          SLP Education - 10/26/15 1252    Education provided Yes   Education Details Patient requires support to participate in activities   Person(s) Educated Patient;Spouse   Methods Explanation   Comprehension Verbalized understanding            SLP Long Term Goals - 09/21/15 1103      SLP LONG TERM GOAL #1   Title Patient will identify cognitive barriers and participate in developing functional compensatory strategies.   Time 8   Period Weeks   Status New     SLP LONG TERM GOAL #2   Title Patient will demonstrate functional use of external memory aids with 80% accuracy within structured setting.   Time 8   Period Weeks   Status New     SLP LONG TERM GOAL #3   Title Patient will complete attention and memory strategy activities with 80% accuracy.   Time 8   Period Weeks          Plan - 10/26/15 1253    Clinical Impression Statement The patient's primary cognitive barrier to participation and independence is lack of personal motivation.  There is probably an element of unwillingness to face her own cognitive decline.  I do not think that the patient can be expected to independently engage in activities that she once enjoyed.  The patient and SLP completed a variety of cognitive  activities.  The patient requires the most support for visual perceptual activities (find hidden pictures, spot the differences) and least support for simple reading activities.  I continue to strongly suggest that the patient's family contact [employer] to find programs that are suitable for her needs.  In the meantime, SLP will continue explore activities that the patient may enjoy and determine the level of support required to participate.   Speech Therapy Frequency 1x /week   Duration Other (comment)   Treatment/Interventions Patient/family education;Cognitive reorganization   Potential to Achieve Goals Good   Potential Considerations  Ability to learn/carryover information;Cooperation/participation level;Previous level of function;Severity of impairments;Family/community support   SLP Home Exercise Plan Recommend contacting [employer], patient and spouse given written list of suggested activities   Consulted and Agree with Plan of Care Patient;Family member/caregiver   Family Member Consulted Spouse      Patient will benefit from skilled therapeutic intervention in order to improve the following deficits and impairments:   Cognitive communication deficit    Problem List There are no active problems to display for this patient.  Dollene Primrose, MS/CCC- SLP  Belinda Gonzalez 10/26/2015, 12:54 PM  [facility] [facility] MAIN [facility] SERVICES [address] [place], [place], [address] Phone: [phone]   Fax:  [phone]   Name: Belinda Gonzalez MRN: [id number] Date of Birth: July 29, 1934

## 2015-10-31 ENCOUNTER — Ambulatory Visit: Payer: Medicare Other | Admitting: Speech Pathology

## 2015-10-31 ENCOUNTER — Encounter: Payer: Self-pay | Admitting: Speech Pathology

## 2015-10-31 DIAGNOSIS — R41841 Cognitive communication deficit: Secondary | ICD-10-CM | POA: Diagnosis not present

## 2015-10-31 NOTE — Therapy (Signed)
[facility] [address] [place], [place], [address] Phone: [phone]   Fax:  [phone]  Speech Language Pathology Treatment  Patient Details  Name: Belinda SkeetersClara T Cerreta MRN: [id number] Date of Birth: [birth year] Referring Provider: Dr. Malvin JohnsPotter  Encounter Date: 10/31/2015      End of Session - 10/31/15 1804    Visit Number 5   Number of Visits 9   Date for SLP Re-Evaluation 11/26/15   SLP Start Time 1410   SLP Stop Time  1500   SLP Time Calculation (min) 50 min   Activity Tolerance Patient tolerated treatment well      Past Medical History:  Diagnosis Date  . COPD (chronic obstructive pulmonary disease) (HCC)   . Depression   . Hypertension   . Thyroid disease     History reviewed. No pertinent surgical history.  There were no vitals filed for this visit.      Subjective Assessment - 10/31/15 1803    Subjective The patient reports not having had anything she needed to do today.    Patient is accompained by: Family member   Currently in Pain? No/denies               ADULT SLP TREATMENT - 10/31/15 0001      General Information   Behavior/Cognition Alert;Cooperative;Pleasant mood;Confused;Decreased sustained attention;Other (comment)  Moderate dementia     Treatment Provided   Treatment provided Cognitive-Linquistic     Pain Assessment   Pain Assessment No/denies pain     Cognitive-Linquistic Treatment   Treatment focused on Cognition;Patient/family/caregiver education   Skilled Treatment Cognitively stimulating activities: The patient quickly completed two numerical connect-the-dot activities without error. She then completed an activity matching a list of four words to a location associated with those items with 100% accuracy. The also ordered three descriptive words in order of severity - 7/7 accuracy with one cue. The patient completed multi-part "if - then" commands with approximately 70%  accuracy with moderate support. Following an example, the patient learned and successfully participated in games "Hang Man" and "Pictionary." Education was provided to patient and spouse regarding importance of social participation (suggested local group activity opportunities for adults) and daily schedule/routines for cognitive stimulation.     Assessment / Recommendations / Plan   Plan Continue with current plan of care     Progression Toward Goals   Progression toward goals Progressing toward goals          SLP Education - 10/31/15 1803    Education provided Yes   Education Details  importance of social participation and daily schedule/routines for cognitive stimulation   Person(s) Educated Patient;Spouse   Methods Explanation   Comprehension Verbalized understanding            SLP Long Term Goals - 09/21/15 1103      SLP LONG TERM GOAL #1   Title Patient will identify cognitive barriers and participate in developing functional compensatory strategies.   Time 8   Period Weeks   Status New     SLP LONG TERM GOAL #2   Title Patient will demonstrate functional use of external memory aids with 80% accuracy within structured setting.   Time 8   Period Weeks   Status New     SLP LONG TERM GOAL #3   Title Patient will complete attention and memory strategy activities with 80% accuracy.   Time 8   Period Weeks  Plan - 10/31/15 1804    Clinical Impression Statement The patient's primary cognitive barrier to participation and independence is lack of personal motivation.  There is probably an element of unwillingness to face her own cognitive decline.  I do not think that the patient can be expected to independently engage in activities that she once enjoyed.  The patient and SLP completed a variety of cognitive activities.  The patient requires the most support for visual perceptual activities (find hidden pictures, spot the differences) and least support for simple  reading activities. The patient is cooperative and learns new, simple language-based activities and games fairly quickly. However, she expresses that she "does not like" all suggested activities and demonstrates little to no motivation to seek opportunities to engage/participate. I continue to strongly suggest that the patient's family contact [employer] to find programs that are suitable for her needs.  In the meantime, SLP will continue explore activities that the patient may enjoy and determine the level of support required to participate.   Speech Therapy Frequency 1x /week   Duration Other (comment)   Treatment/Interventions Patient/family education;Cognitive reorganization   Potential to Achieve Goals Good   Potential Considerations Ability to learn/carryover information;Cooperation/participation level;Previous level of function;Severity of impairments;Family/community support   SLP Home Exercise Plan importance of social participation (suggested local group activity opportunities for adults / [employer]) and daily schedule/routines for cognitive stimulation   Consulted and Agree with Plan of Care Patient;Family member/caregiver   Family Member Consulted Spouse      Patient will benefit from skilled therapeutic intervention in order to improve the following deficits and impairments:   Cognitive communication deficit    Problem List There are no active problems to display for this patient.   Raymondo Band  Graduate Student Clinician 10/31/2015, 6:07 PM  [facility] MAIN [facility] SERVICES [address] [place], [place], [address] Phone: [phone]   Fax:  [phone]   Name: Belinda Gonzalez MRN: [id number] Date of Birth: March 08, 1934

## 2015-11-02 ENCOUNTER — Encounter: Payer: Medicare Other | Admitting: Speech Pathology

## 2015-11-08 ENCOUNTER — Ambulatory Visit: Payer: Medicare Other | Attending: Neurology | Admitting: Speech Pathology

## 2015-11-08 DIAGNOSIS — R41841 Cognitive communication deficit: Secondary | ICD-10-CM | POA: Insufficient documentation

## 2015-11-09 ENCOUNTER — Encounter: Payer: Self-pay | Admitting: Speech Pathology

## 2015-11-09 NOTE — Therapy (Signed)
[facility] [address] [place], [place], [address] Phone: [phone]   Fax:  [phone]  Speech Language Pathology Treatment  Patient Details  Name: Belinda Gonzalez MRN: [id number] Date of Birth: 06-28-1934 Referring Provider: Dr. Malvin JohnsPotter  Encounter Date: 11/08/2015      End of Session - 11/09/15 0949    Visit Number 6   Number of Visits 9   Date for SLP Re-Evaluation 11/26/15   SLP Start Time 1305   SLP Stop Time  1400   SLP Time Calculation (min) 55 min      Past Medical History:  Diagnosis Date  . COPD (chronic obstructive pulmonary disease) (HCC)   . Depression   . Hypertension   . Thyroid disease     History reviewed. No pertinent surgical history.  There were no vitals filed for this visit.      Subjective Assessment - 11/09/15 0948    Subjective "I don't want any responsibilities"  "I haven't been doing anything"    Patient is accompained by: Family member   Currently in Pain? No/denies               ADULT SLP TREATMENT - 11/09/15 0001      General Information   Behavior/Cognition Alert;Cooperative;Pleasant mood;Confused;Decreased sustained attention;Other (comment)  Moderate dementia     Treatment Provided   Treatment provided Cognitive-Linquistic     Pain Assessment   Pain Assessment No/denies pain     Cognitive-Linquistic Treatment   Treatment focused on Cognition;Patient/family/caregiver education   Skilled Treatment Cognitively stimulating activities: The patient and SLP completed a variety of cognitive activities.  The patient requires the most support for visual perceptual activities (find hidden pictures, spot the differences) and least support for cognitive-linguistic activities.       Assessment / Recommendations / Plan   Plan Continue with current plan of care     Progression Toward Goals   Progression toward goals Progressing toward goals          SLP Education  - 11/09/15 0949    Education provided Yes   Education Details Patient educated RE: her improved engagement in activities we did today   Person(s) Educated Patient;Spouse   Methods Explanation   Comprehension Verbalized understanding            SLP Long Term Goals - 09/21/15 1103      SLP LONG TERM GOAL #1   Title Patient will identify cognitive barriers and participate in developing functional compensatory strategies.   Time 8   Period Weeks   Status New     SLP LONG TERM GOAL #2   Title Patient will demonstrate functional use of external memory aids with 80% accuracy within structured setting.   Time 8   Period Weeks   Status New     SLP LONG TERM GOAL #3   Title Patient will complete attention and memory strategy activities with 80% accuracy.   Time 8   Period Weeks          Plan - 11/09/15 0950    Clinical Impression Statement The patient's primary cognitive barrier to participation and independence is lack of personal motivation.  There is probably an element of unwillingness to face her own cognitive decline.  I do not think that the patient can be expected to independently engage in activities that she once enjoyed.  The patient and SLP completed a variety of cognitive activities.  The patient requires the most  support for visual perceptual activities (find hidden pictures, spot the differences) and least support for cognitive-linguistic activities.  The patient was more engaged and participated longer with less support today.  I continue to strongly suggest that the patient's family contact [employer] to find programs that are suitable for her needs.  In the meantime, SLP will continue explore activities that the patient may enjoy and determine the level of support required to participate.   Speech Therapy Frequency 1x /week   Duration Other (comment)   Treatment/Interventions Patient/family education;Cognitive reorganization   Potential to Achieve Goals Good    Potential Considerations Ability to learn/carryover information;Cooperation/participation level;Previous level of function;Severity of impairments;Family/community support   SLP Home Exercise Plan importance of social participation (suggested local group activity opportunities for adults / [employer]) and daily schedule/routines for cognitive stimulation   Consulted and Agree with Plan of Care Patient;Family member/caregiver   Family Member Consulted Spouse      Patient will benefit from skilled therapeutic intervention in order to improve the following deficits and impairments:   Cognitive communication deficit    Problem List There are no active problems to display for this patient.  Dollene PrimroseSusan G Andreus Cure, MS/CCC- SLP  Leandrew KoyanagiAbernathy, Susie 11/09/2015, 9:51 AM  [facility] [address] [place], [place], [address] Phone: [phone]   Fax:  [phone]   Name: Belinda Gonzalez MRN: [id number] Date of Birth: [birth year]

## 2015-11-16 ENCOUNTER — Ambulatory Visit: Payer: Medicare Other | Admitting: Speech Pathology

## 2015-11-16 ENCOUNTER — Encounter: Payer: Self-pay | Admitting: Speech Pathology

## 2015-11-16 DIAGNOSIS — R41841 Cognitive communication deficit: Secondary | ICD-10-CM | POA: Diagnosis not present

## 2015-11-16 NOTE — Therapy (Signed)
[facility] [address] [place], [place], [address] Phone: [phone]   Fax:  [phone]  Speech Language Pathology Treatment  Patient Details  Name: Gaspar SkeetersClara T Ansley MRN: [id number] Date of Birth: [birth year] Referring Provider: Dr. Malvin JohnsPotter  Encounter Date: 11/16/2015      End of Session - 11/16/15 1534    Visit Number 7   Number of Visits 9   Date for SLP Re-Evaluation 11/26/15   SLP Start Time 1410   SLP Stop Time  1510   SLP Time Calculation (min) 60 min      Past Medical History:  Diagnosis Date  . COPD (chronic obstructive pulmonary disease) (HCC)   . Depression   . Hypertension   . Thyroid disease     History reviewed. No pertinent surgical history.  There were no vitals filed for this visit.      Subjective Assessment - 11/16/15 1533    Subjective "I don't want any responsibilities"  "I haven't been doing anything"    Currently in Pain? No/denies               ADULT SLP TREATMENT - 11/16/15 0001      General Information   Behavior/Cognition Alert;Cooperative;Pleasant mood;Confused;Decreased sustained attention;Other (comment)  Moderate dementia     Treatment Provided   Treatment provided Cognitive-Linquistic     Pain Assessment   Pain Assessment No/denies pain     Cognitive-Linquistic Treatment   Treatment focused on Cognition;Patient/family/caregiver education   Skilled Treatment Cognitively stimulating activities: The patient and SLP completed a variety of cognitive activities.  The patient requires the most support for visual perceptual activities (find hidden pictures, spot the differences) and least support for cognitive-linguistic activities.  Patient demonstrates increased independence and sustained attention.     Assessment / Recommendations / Plan   Plan Continue with current plan of care     Progression Toward Goals   Progression toward goals Progressing toward goals           SLP Education - 11/16/15 1533    Education provided Yes   Education Details Patient educated RE: her improved engagement in activities we did today   Person(s) Educated Patient   Methods Explanation   Comprehension Verbalized understanding            SLP Long Term Goals - 09/21/15 1103      SLP LONG TERM GOAL #1   Title Patient will identify cognitive barriers and participate in developing functional compensatory strategies.   Time 8   Period Weeks   Status New     SLP LONG TERM GOAL #2   Title Patient will demonstrate functional use of external memory aids with 80% accuracy within structured setting.   Time 8   Period Weeks   Status New     SLP LONG TERM GOAL #3   Title Patient will complete attention and memory strategy activities with 80% accuracy.   Time 8   Period Weeks          Plan - 11/16/15 1534    Clinical Impression Statement The patient's primary cognitive barrier to participation and independence is lack of personal motivation.  There is probably an element of unwillingness to face her own cognitive decline.  I do not think that the patient can be expected to independently engage in activities that she once enjoyed.  The patient and SLP completed a variety of cognitive activities.  The patient requires the most support for  visual perceptual activities (find hidden pictures, spot the differences) and least support for cognitive-linguistic activities.  The patient was more engaged and participated longer with less support today.  I continue to strongly suggest that the patient's family contact [facility] to find programs that are suitable for her needs.  In the meantime, SLP will continue explore activities that the patient may enjoy and determine the level of support required to participate.   Speech Therapy Frequency 1x /week   Duration Other (comment)   Treatment/Interventions Patient/family education;Cognitive reorganization   Potential to Achieve  Goals Good   Potential Considerations Ability to learn/carryover information;Cooperation/participation level;Previous level of function;Severity of impairments;Family/community support   SLP Home Exercise Plan importance of social participation (suggested local group activity opportunities for adults / Elder[facility]) and daily schedule/routines for cognitive stimulation   Consulted and Agree with Plan of Care Patient      Patient will benefit from skilled therapeutic intervention in order to improve the following deficits and impairments:   Cognitive communication deficit    Problem List There are no active problems to display for this patient.  Dollene PrimroseSusan G Nataleah Scioneaux, MS/CCC- SLP  Leandrew KoyanagiAbernathy, Susie 11/16/2015, 3:35 PM  [place] [facility] MA[facility] [address] [place], [place], [address] Phone: [phone]   Fax:  [phone]   Name: Gaspar SkeetersClara T Roorda MRN: [id number] Date of Birth: 12-23-1934

## 2015-11-23 ENCOUNTER — Ambulatory Visit: Payer: Medicare Other | Admitting: Speech Pathology

## 2015-11-23 ENCOUNTER — Encounter: Payer: Self-pay | Admitting: Speech Pathology

## 2015-11-23 DIAGNOSIS — R41841 Cognitive communication deficit: Secondary | ICD-10-CM | POA: Diagnosis not present

## 2015-11-23 NOTE — Therapy (Signed)
[place] MAIN [facility] SERVICES [address] [place], [place], [address] Phone: [phone]   Fax:  [phone]  Speech Language Pathology Treatment / Discharge Summary  Patient Details  Name: Belinda Gonzalez MRN: [id number] Date of Birth: [birth year] Referring Provider: Dr. Melrose Nakayama  Encounter Date: 11/23/2015      End of Session - 11/23/15 1639    Visit Number 8   Number of Visits 9   Date for SLP Re-Evaluation 11/26/15   SLP Start Time 1400   SLP Stop Time  1440   SLP Time Calculation (min) 40 min   Activity Tolerance Patient tolerated treatment well      Past Medical History:  Diagnosis Date  . COPD (chronic obstructive pulmonary disease) ([place])   . Depression   . Hypertension   . Thyroid disease     History reviewed. No pertinent surgical history.  There were no vitals filed for this visit.      Subjective Assessment - 11/23/15 1636    Subjective The patient responded to clinician's recommendations for future care by saying, "You think so? Okay." The spouse indicated a willingness to investigate adult day care activities for the patient.   Patient is accompained by: Family member   Currently in Pain? No/denies               ADULT SLP TREATMENT - 11/23/15 0001      General Information   Behavior/Cognition Alert;Cooperative;Pleasant mood;Confused;Decreased sustained attention;Other (comment)  Moderate dementia     Treatment Provided   Treatment provided Cognitive-Linquistic     Pain Assessment   Pain Assessment No/denies pain     Cognitive-Linquistic Treatment   Treatment focused on Cognition;Patient/family/caregiver education   Skilled Treatment Education was provided to the patient and her spouse based on observations from intervention. The clinician recommended providing enrichment for the patient via a structured home routine, guided activities with a partner, stimulating environment, and orientation aids  (calendars & clocks, whiteboard with list of things to do today). The clinician responded to spouse's questions and comments, emphasizing that the patient cannot be expected to problem-solve, make decisions, remember new information, or take initiative to meet her needs or engage in activities that she would actually benefit from and enjoy. The spouse and clinician discussed that visual aids in the home (notes, calendars) need to be more obvious for the patient - she will not seek them out but may benefit from them if they are frequently in her line of sight/hard to miss. The clinician recommended supplementing the patient's home routine with activities in a structured environment with staff knowledgeable about engaging with people with dementia. Information was shared regarding Elder Care, two local adult day care options, and many related resources. The clinician explained how the patient's participation in therapy sessions over time demonstrated that she is likely to warm up to new activities, people, and environments with consistent exposure. This is an indicator that a setting such as an adult day care program will likely be enjoyable for her as she becomes accustomed to it.      Assessment / Recommendations / Plan   Plan Discharge SLP treatment due to (comment);All goals met  patient would be best served by structured group activities     Progression Toward Goals   Progression toward goals Goals met, education completed, patient discharged from [facility] - 11/23/15 1637    Education provided Yes   Education  Details Patient and spouse educated RE: nature of dementia, expectations for patient's function at home, necessary home environment supports, patient's response to regular ST intervention indicates responsiveness to structured programming, recommended services and resources to continue to support patient   Person(s) Educated Patient;Spouse   Methods Explanation    Comprehension Verbalized understanding            SLP Long Term Goals - 11/23/15 1642      SLP LONG TERM GOAL #1   Title Patient will identify cognitive barriers and participate in developing functional compensatory strategies.   Time 8   Period Weeks   Status Not Met  The patient is receptive to the clinician's observations and requires guidance/partner support and environmental supports. She cannot implement compensatory strategies or identify deficits independently.     SLP LONG TERM GOAL #2   Title Patient will demonstrate functional use of external memory aids with 80% accuracy within structured setting.   Time 8   Period Weeks   Status Not Met  Education was provided and demonstrated regarding use of memory and orientation aids in home environment.     SLP LONG TERM GOAL #3   Title Patient will complete attention and memory strategy activities with 80% accuracy.   Time 8   Period Weeks   Status Partially Met  Patient engages in activities with support from a partner.          Plan - 11/23/15 1640    Clinical Impression Statement The patient's primary cognitive barrier to participation and independence is lack of motivation and initiation. The patient has demonstrated that she requires support in order to engage in activities, and she is not able to participate in decision-making. However, her linguistic abilities are very strong, and there are many activities she completes successfully with a partner's encouragement and support. When asked, the patient usually reports that she "didn't like" activities. However, her demeanor and behavior toward clinician indicates that she enjoys these visits. The patient may verbalize resistance to participating, but she appears to warm up to new people and activities over time with regular exposure. Interactive opportunities and stimulating activities are beneficial for her cognitive and social-emotional function. The clinician encourages  the patient and her spouse to pursue a structured day care environment at least once or twice a week to continue to offer the patient routine, social opportunities, and enrichment in a structured environment with staff who are knowledgeable to help her engage. Many local resources were shared, including two options for adult day care services.    Speech Therapy Frequency 1x /week   Duration Other (comment)   Treatment/Interventions Patient/family education;Cognitive reorganization   Potential to Achieve Goals Good   Potential Considerations Ability to learn/carryover information;Cooperation/participation level;Previous level of function;Severity of impairments;Family/community support   SLP Home Exercise Plan importance of social participation (suggested local group activity opportunities for adults / Elder[facility]), daily schedule/routines for cognitive stimulation, and environmental supports for orientation   Consulted and Agree with Plan of Care Patient   Family Member Consulted Spouse      Patient will benefit from skilled therapeutic intervention in order to improve the following deficits and impairments:   Cognitive communication deficit    Problem List There are no active problems to display for this patient.   Rickard Rhymes  Graduate Student Clinician 11/23/2015, 4:47 PM  [place] MAIN [facility] SERVICES [address] [place], [place], [address] Phone: [phone]   Fax:  [phone]   Name:  LAVANA HUCKEBA MRN: [id number] Date of Birth: [birth year]

## 2016-12-01 ENCOUNTER — Other Ambulatory Visit: Payer: Self-pay | Admitting: Family Medicine

## 2016-12-01 DIAGNOSIS — T17920S Food in respiratory tract, part unspecified causing asphyxiation, sequela: Secondary | ICD-10-CM

## 2016-12-01 DIAGNOSIS — T17928S Food in respiratory tract, part unspecified causing other injury, sequela: Secondary | ICD-10-CM

## 2016-12-17 ENCOUNTER — Ambulatory Visit: Payer: Medicare Other | Attending: Family Medicine

## 2017-01-15 ENCOUNTER — Ambulatory Visit
Admission: RE | Admit: 2017-01-15 | Discharge: 2017-01-15 | Disposition: A | Discharge status: Home / Self Care | Payer: Medicare Other | Source: Ambulatory Visit | Attending: Family Medicine | Admitting: Family Medicine

## 2017-01-15 ENCOUNTER — Encounter: Payer: Self-pay | Admitting: Radiology

## 2017-01-15 DIAGNOSIS — X58XXXS Exposure to other specified factors, sequela: Secondary | ICD-10-CM | POA: Insufficient documentation

## 2017-01-15 DIAGNOSIS — R1312 Dysphagia, oropharyngeal phase: Secondary | ICD-10-CM | POA: Diagnosis not present

## 2017-01-15 DIAGNOSIS — T17920S Food in respiratory tract, part unspecified causing asphyxiation, sequela: Secondary | ICD-10-CM

## 2017-01-15 DIAGNOSIS — T17890S Other foreign object in other parts of respiratory tract causing asphyxiation, sequela: Secondary | ICD-10-CM | POA: Insufficient documentation

## 2017-01-15 DIAGNOSIS — T17928S Food in respiratory tract, part unspecified causing other injury, sequela: Secondary | ICD-10-CM

## 2017-01-15 NOTE — Therapy (Addendum)
[facility] DIAGNOSTIC RADIOLOGY [address] [place], [place], [address] Phone: [phone]   Fax:     Modified Barium Swallow  Patient Details  Name: Belinda Gonzalez MRN: [id number] Date of Birth: January 20, 1934 No Data Recorded  Encounter Date: 01/15/2017  End of Session - 01/15/17 1834    Visit Number  1    Number of Visits  1    Date for SLP Re-Evaluation  01/15/17    SLP Start Time  1300    SLP Stop Time   1330    SLP Time Calculation (min)  30 min    Activity Tolerance  Patient tolerated treatment well       Past Medical History:  Diagnosis Date  . COPD (chronic obstructive pulmonary disease) (HCC)   . Depression   . Hypertension   . Thyroid disease     No past surgical history on file.  There were no vitals filed for this visit.     Subjective: Patient behavior: (alertness, ability to follow instructions, etc.): pt was A/O; verbally engaged w/ SLP. Family present to given information as well. Pt does require min cues for full follow through(distraction). Pt/family described pt "getting choked" when eating peanuts one day and felt it was an intense situation. Pt does have ill-fitting dentures.  Chief complaint: dysphagia of particulate foods(nuts, rice, cornbread reported by pt/family)   Objective:  Radiological Procedure: A videoflouroscopic evaluation of oral-preparatory, reflex initiation, and pharyngeal phases of the swallow was performed; as well as a screening of the upper esophageal phase.  I. POSTURE: upright II. VIEW: lateral III. COMPENSATORY STRATEGIES: chew foods carefully, thoroughly; f/u lingual sweep and swallow to orally clear if needed d/t dentures IV. BOLUSES ADMINISTERED:  Thin Liquid: 4 trials; 2 trials via straw  Nectar-thick Liquid: 2 trials  Honey-thick Liquid: NT  Puree: 3 trials  Mechanical Soft: 2 trials V. RESULTS OF EVALUATION: A. ORAL PREPARATORY PHASE: (The lips, tongue, and velum are  observed for strength and coordination)       **Overall Severity Rating: WFL-MILD. Pt exhibited increased mastication time/effort w/ increased textured boluses - suspect ill-fitting denture plate impacted bolus mastication and control somewhat. With liquid and puree boluses, no oral phase deficits were noted; timely A-P transfer and bolus control. Overall, adequate oral clearing b/t trials.   B. SWALLOW INITIATION/REFLEX: (The reflex is normal if "triggered" by the time the bolus reached the base of the tongue)  **Overall Severity Rating: grossly WFL. Timing of the pharyngeal swallow initiation appeared to occur at approximately the level of the valleculae; min spilling from the valleculae. Liquids were transferred quickly from the oral cavity and moved directly to the mid-pharyngeal space as swallow initiation occurred. When pt drank from a straw(which she stated she did at home), slight amount of laryngeal Penetration occurred w/ the larger bolus sips. When pt drank from the cup using single, smaller sip slowly, no Aspiration occurred.    C. PHARYNGEAL PHASE: (Pharyngeal function is normal if the bolus shows rapid, smooth, and continuous transit through the pharynx and there is no pharyngeal residue after the swallow)  **Overall Severity Rating: [facility]. No pharyngeal residue remained post swallow w/ all trial consistencies indicating adequate laryngeal excursion and pharyngeal pressure during the swallow.  D. LARYNGEAL PENETRATION: (Material entering into the laryngeal inlet/vestibule but not aspirated): slight x2 when using a Straw to drink thin liquids; when using a Cup to drink and smaller bolus size, none noted. E. ASPIRATION: NONE F. ESOPHAGEAL PHASE: (  Screening of the upper esophagus): appeared adequate for bolus clearing of the Cervical Esophagus viewable.  ASSESSMENT: Pt appeared to present w/ grossly adequate oropharyngeal phase swallowing function w/ no aspiration occurring during this study.  Overall oropharyngeal strength/ROM appeared [facility] w/ no OM deficits noted. During the oral phase, pt exhibited increased mastication time/effort w/ increased textured boluses - suspect ill-fitting denture plate impacted bolus mastication and control somewhat. With liquid and puree boluses, no oral phase deficits were noted; timely A-P transfer and bolus control. Overall, adequate oral clearing b/t trials. No deficits in OM strength or ROM noted. During the pharyngeal phase, no pharyngeal residue remained post swallow w/ all trial consistencies indicating adequate laryngeal excursion and pharyngeal pressure during the swallow. Timing of the pharyngeal swallow initiation appeared to occur at approximately the level of the valleculae; min spilling of liquids from the valleculae noted. Liquids were transferred quickly from the oral cavity and moved directly to the mid-pharyngeal space as swallow initiation occurred. When pt drank using a Straw(which she stated she did at home), slight amount of laryngeal Penetration occurred w/ the larger bolus sips. When pt drank from the Cup using single, smaller sip slowly as instructed, no Aspiration occurred. The Esophageal phase of swallowing appeared adequate w/ bolus motility and clearing of are viewable in the Cervical Esophagus. Discussed general aspiration precautions; ways to modify/moisten foods; encouraged small, single bites and sips w/ all foods/drinks; eating/drinking slowly and reducing distractions during meals to focus on meals. Thoroughly discussed the need to avoid/lessen PROBLEMATIC FOODS such as peanuts or other particulate foods(cornbread, rice, popcorn) that increase discomfort when eating.   PLAN/RECOMMENDATIONS:  A. Diet: Mechanical Soft diet w/ Thin liquids; general aspiration precautions; Pills in Puree IF any difficulty swallowing w/ liquids. General behavioral strategies to monitor foods consistencies(dry particulates) and eating environment including to  wear Upper Denture Plate(secured) to aid mastication of foods.  B. Swallowing Precautions: general aspiration precautions - NO Straws would be recommended   C. Recommended consultation: f/u w/ Dentist if bottom denture plate is ill-fitting  D. Therapy recommendations: none at this time  E. Results and recommendations were discussed w/ pt, husband, and daughter; video viewed and discussed w/ all.           Dysphagia, oropharyngeal phase  Aspiration of food, sequela - Plan: DG OP Swallowing Func-Medicare/Speech Path, DG OP Swallowing Func-Medicare/Speech Path  G-Codes - 01/15/17 1835    Functional Assessment Tool Used  clinical judgement    Functional Limitations  Swallowing    Swallow Current Status ([id number])  At least 1 percent but less than 20 percent impaired, limited or restricted    Swallow Goal Status ([id number]([id number])  At least 1 percent but less than 20 percent impaired, limited or restricted    Swallow Discharge Status [phone]([id number])  At least 1 percent but less than 20 percent impaired, limited or restricted           Problem List There are no active problems to display for this patient.     Jerilynn SomKatherine Eura Mccauslin, MS, CCC-SLP [name] Silverman 01/15/2017, 6:36 PM  [facility] [facility] DIAG[facility]IC RADIOLOGY [address] [place], [place], [address] Phone: [phone]   Fax:     Name: Gaspar SkeetersClara T Gonzalez MRN: [id number] Date of Birth: June 22, 1934

## 2017-03-09 ENCOUNTER — Encounter: Payer: Self-pay | Admitting: General Surgery

## 2017-06-27 ENCOUNTER — Encounter: Payer: Self-pay | Admitting: Emergency Medicine

## 2017-06-27 ENCOUNTER — Emergency Department
Admission: EM | Admit: 2017-06-27 | Discharge: 2017-06-27 | Disposition: A | Discharge status: Home / Self Care | Payer: Medicare Other | Attending: Emergency Medicine | Admitting: Emergency Medicine

## 2017-06-27 ENCOUNTER — Emergency Department: Payer: Medicare Other

## 2017-06-27 ENCOUNTER — Other Ambulatory Visit: Payer: Self-pay

## 2017-06-27 DIAGNOSIS — J449 Chronic obstructive pulmonary disease, unspecified: Secondary | ICD-10-CM | POA: Insufficient documentation

## 2017-06-27 DIAGNOSIS — Z87891 Personal history of nicotine dependence: Secondary | ICD-10-CM | POA: Insufficient documentation

## 2017-06-27 DIAGNOSIS — Z7982 Long term (current) use of aspirin: Secondary | ICD-10-CM | POA: Diagnosis not present

## 2017-06-27 DIAGNOSIS — Z79899 Other long term (current) drug therapy: Secondary | ICD-10-CM | POA: Diagnosis not present

## 2017-06-27 DIAGNOSIS — E079 Disorder of thyroid, unspecified: Secondary | ICD-10-CM | POA: Diagnosis not present

## 2017-06-27 DIAGNOSIS — M79601 Pain in right arm: Secondary | ICD-10-CM

## 2017-06-27 DIAGNOSIS — I10 Essential (primary) hypertension: Secondary | ICD-10-CM | POA: Diagnosis not present

## 2017-06-27 DIAGNOSIS — R52 Pain, unspecified: Secondary | ICD-10-CM

## 2017-06-27 LAB — BASIC METABOLIC PANEL
Anion gap: 6 (ref 5–15)
BUN: 16 mg/dL (ref 6–20)
CO2: 22 mmol/L (ref 22–32)
CREATININE: 0.77 mg/dL (ref 0.44–1.00)
Calcium: 9.3 mg/dL (ref 8.9–10.3)
Chloride: 110 mmol/L (ref 101–111)
Glucose, Bld: 123 mg/dL — ABNORMAL HIGH (ref 65–99)
Potassium: 4 mmol/L (ref 3.5–5.1)
SODIUM: 138 mmol/L (ref 135–145)

## 2017-06-27 LAB — CBC
HCT: 41.5 % (ref 35.0–47.0)
Hemoglobin: 14.1 g/dL (ref 12.0–16.0)
MCH: 31.6 pg (ref 26.0–34.0)
MCHC: 33.9 g/dL (ref 32.0–36.0)
MCV: 93.2 fL (ref 80.0–100.0)
PLATELETS: 158 10*3/uL (ref 150–440)
RBC: 4.46 MIL/uL (ref 3.80–5.20)
RDW: 13.8 % (ref 11.5–14.5)
WBC: 6.1 10*3/uL (ref 3.6–11.0)

## 2017-06-27 LAB — TROPONIN I: Troponin I: <0.03 ng/mL (ref <0.03)

## 2017-06-27 NOTE — Discharge Instructions (Signed)
Please follow-up with your doctor in the next several days for recheck/reevaluation.  Return to the emergency department for any chest pain, trouble breathing, or any other symptom personally concerning to yourself. °

## 2017-06-27 NOTE — ED Provider Notes (Signed)
[facility] Emergency Department Provider Note  Time seen: 5:21 PM  I have reviewed the triage vital signs and the nursing notes.   HISTORY  Chief Complaint Chest Pain    HPI Belinda Gonzalez is a 83 y.o. female with a past medical history of COPD, depression, hypertension, presents to the emergency department for right arm pain.  According to the patient and her caregiver 2 days ago the patient was complaining of right forearm pain, states she used a lidocaine ointment on the forearm and the pain went away.  Earlier today she states that the forearm pain returned, caregiver states she then complained of pain in the elbow and approximately 20 to 30 minutes later complained of pain up into her right shoulder.  Caregiver became concerned so she brought her to the emergency department for evaluation.  Here the patient denies any weakness or numbness, headache, confusion or slurred speech.  Denies any pain in the chest at any point, shortness of breath nausea or diaphoresis.  States the pain in the right arm has completely resolved.  Describes the pain as moderate aching type pain when it was present but is now completely gone per patient.   Past Medical History:  Diagnosis Date  . COPD (chronic obstructive pulmonary disease) (HCC)   . Depression   . Hypertension   . Thyroid disease     There are no active problems to display for this patient.   Past Surgical History:  Procedure Laterality Date  . CATARACT EXTRACTION      Prior to Admission medications   Medication Sig Start Date End Date Taking? Authorizing Provider  acetaminophen (TYLENOL) 500 MG tablet Take 1,000 mg by mouth 2 (two) times daily.   Yes [provider]  alendronate (FOSAMAX) 70 MG tablet Take 70 mg by mouth every 7 (seven) days. 03/23/17  Yes [provider]  aspirin EC 81 MG tablet Take 81 mg by mouth daily.   Yes [provider]  Cholecalciferol (VITAMIN D3) 50000  units TABS Take 50,000 Units by mouth every 7 (seven) days.   Yes [provider]  levothyroxine (SYNTHROID, LEVOTHROID) 75 MCG tablet Take 75 mcg by mouth daily before breakfast.    Yes [provider]  lisinopril (PRINIVIL,ZESTRIL) 10 MG tablet Take 10 mg by mouth 2 (two) times daily.    Yes [provider]  memantine (NAMENDA) 10 MG tablet Take 10 mg by mouth 2 (two) times daily.    Yes [provider]  oxybutynin (DITROPAN) 5 MG tablet Take 5 mg by mouth 2 (two) times daily. 05/06/17  Yes [provider]  rosuvastatin (CRESTOR) 10 MG tablet Take 10 mg by mouth at bedtime.    Yes [provider]  vitamin B-12 (CYANOCOBALAMIN) 1000 MCG tablet Take 1,000 mcg by mouth daily.   Yes [provider]    Allergies  Allergen Reactions  . Lipitor [Atorvastatin]     No family history on file.  Social History Social History   Tobacco Use  . Smoking status: Former Smoker    Last attempt to quit: 01/07/1999    Years since quitting: 18.4  . Smokeless tobacco: Never Used  Substance Use Topics  . Alcohol use: Not Currently  . Drug use: Not Currently    Review of Systems Constitutional: Negative for fever. Eyes: Negative for visual complaints ENT: Negative for recent illness/congestion Cardiovascular: Negative for chest pain. Respiratory: Negative for shortness of breath. Gastrointestinal: Negative for abdominal pain Musculoskeletal:  Right upper extremity pain, now gone Skin: Negative for skin complaints  Neurological: Negative for headache, slurred speech, confusion, weakness or numbness of any arm or leg. All other ROS negative  ____________________________________________   PHYSICAL EXAM:  VITAL SIGNS: ED Triage Vitals  Enc Vitals Group     BP 06/27/17 1610 127/78     Pulse Rate 06/27/17 1610 64     Resp 06/27/17 1610 16     Temp 06/27/17 1614 98.2 F (36.8 C)     Temp Source 06/27/17 1610 Oral     SpO2 06/27/17  1610 97 %     Weight 06/27/17 1611 189 lb (85.7 kg)     Height 06/27/17 1611 5\' 8"  (1.727 m)     Head Circumference --      Peak Flow --      Pain Score 06/27/17 1611 7     Pain Loc --      Pain Edu? --      Excl. in [place]? --    Constitutional: Alert and oriented. Well appearing and in no distress. Eyes: Normal exam ENT   Head: Normocephalic and atraumatic.   Mouth/Throat: Mucous membranes are moist. Cardiovascular: Normal rate, regular rhythm. No murmur Respiratory: Normal respiratory effort without tachypnea nor retractions. Breath sounds are clear  Gastrointestinal: Soft and nontender. No distention.   Musculoskeletal: Nontender with normal range of motion in all extremities.  No upper or lower extremity tenderness or edema.  Specifically great range of motion in the right upper extremity neurovascularly intact with no tenderness on evaluation, no edema. Neurologic:  Normal speech and language. No gross focal neurologic deficits.  Very strong grip strengths equal bilaterally.  5/5 motor in all extremities.  No cranial nerve deficits appreciated. Skin:  Skin is warm, dry and intact.  Psychiatric: Mood and affect are normal. Speech and behavior are normal.   ____________________________________________    EKG  EKG reviewed and interpreted by myself shows normal sinus rhythm at 66 bpm with a narrow QRS, normal axis, normal intervals besides slight PR prolongation, no concerning ST changes.  ____________________________________________    RADIOLOGY  Chest x-ray negative  ____________________________________________   INITIAL IMPRESSION / ASSESSMENT AND PLAN / ED COURSE  Pertinent labs & imaging results that were available during my care of the patient were reviewed by me and considered in my medical decision making (see chart for details).  Patient presents to the emergency department for right arm pain.  Has been intermittent over the past 2 to 3 days per patient and  caregiver.  Denies any discomfort currently.  Denies ever having pain in her chest or trouble breathing, nausea or diaphoresis.  Differential nonetheless would include ACS, musculoskeletal pain, DVT.  Neurological exam is intact, denies any weakness or numbness at any point.  Labs are reassuring including negative troponin.  Chest x-ray is negative.  We will obtain an ultrasound of the right upper extremity as a precaution.  Patient agreeable to this plan of care.  Ultrasound is negative for DVT.  Patient continues to appear well with no complaints.  We will discharge the patient with PCP follow-up.  Patient agreeable to plan of care. ____________________________________________   FINAL CLINICAL IMPRESSION(S) / ED DIAGNOSES  Right upper extremity pain    Minna Antis, MD 06/27/17 [phone]

## 2017-06-27 NOTE — ED Triage Notes (Signed)
Pt to ED via POV c/o right sided chest pain that started in her right forearm and elbow and radiated into the right shoulder and chest. Pt caregiver states that pt seems to be getting more short of breath when she was getting out of the car. Pt is in NAD at this time. Pain started about 1 hour PTA.

## 2017-07-16 ENCOUNTER — Other Ambulatory Visit: Payer: Self-pay | Admitting: Orthopedic Surgery

## 2017-07-16 DIAGNOSIS — M4802 Spinal stenosis, cervical region: Secondary | ICD-10-CM

## 2017-07-16 DIAGNOSIS — M5412 Radiculopathy, cervical region: Secondary | ICD-10-CM

## 2017-07-16 DIAGNOSIS — M5030 Other cervical disc degeneration, unspecified cervical region: Secondary | ICD-10-CM

## 2017-07-16 DIAGNOSIS — M25511 Pain in right shoulder: Secondary | ICD-10-CM

## 2017-07-30 ENCOUNTER — Ambulatory Visit
Admission: RE | Admit: 2017-07-30 | Discharge: 2017-07-30 | Disposition: A | Discharge status: Home / Self Care | Payer: Medicare Other | Source: Ambulatory Visit | Attending: Orthopedic Surgery | Admitting: Orthopedic Surgery

## 2017-07-30 DIAGNOSIS — M2578 Osteophyte, vertebrae: Secondary | ICD-10-CM | POA: Insufficient documentation

## 2017-07-30 DIAGNOSIS — M50323 Other cervical disc degeneration at C6-C7 level: Secondary | ICD-10-CM | POA: Diagnosis not present

## 2017-07-30 DIAGNOSIS — M5412 Radiculopathy, cervical region: Secondary | ICD-10-CM | POA: Diagnosis present

## 2017-07-30 DIAGNOSIS — M5030 Other cervical disc degeneration, unspecified cervical region: Secondary | ICD-10-CM

## 2017-07-30 DIAGNOSIS — M25511 Pain in right shoulder: Secondary | ICD-10-CM

## 2017-07-30 DIAGNOSIS — M4802 Spinal stenosis, cervical region: Secondary | ICD-10-CM | POA: Diagnosis not present

## 2021-01-15 DIAGNOSIS — Z66 Do not resuscitate: Secondary | ICD-10-CM | POA: Diagnosis present

## 2021-05-15 ENCOUNTER — Other Ambulatory Visit: Payer: Self-pay | Admitting: Gastroenterology

## 2021-05-15 DIAGNOSIS — R1310 Dysphagia, unspecified: Secondary | ICD-10-CM

## 2021-05-27 ENCOUNTER — Ambulatory Visit: Admission: RE | Admit: 2021-05-27 | Payer: Medicare Other | Source: Ambulatory Visit

## 2021-05-28 ENCOUNTER — Ambulatory Visit
Admission: RE | Admit: 2021-05-28 | Discharge: 2021-05-28 | Disposition: A | Discharge status: Home / Self Care | Payer: Medicare Other | Source: Ambulatory Visit | Attending: Gastroenterology | Admitting: Gastroenterology

## 2021-05-28 DIAGNOSIS — R1310 Dysphagia, unspecified: Secondary | ICD-10-CM | POA: Diagnosis present

## 2021-10-08 ENCOUNTER — Emergency Department
Admission: EM | Admit: 2021-10-08 | Discharge: 2021-10-08 | Disposition: A | Discharge status: Home / Self Care | Payer: Medicare Other | Attending: Emergency Medicine | Admitting: Emergency Medicine

## 2021-10-08 ENCOUNTER — Other Ambulatory Visit: Payer: Self-pay

## 2021-10-08 ENCOUNTER — Emergency Department: Payer: Medicare Other

## 2021-10-08 DIAGNOSIS — R141 Gas pain: Secondary | ICD-10-CM

## 2021-10-08 DIAGNOSIS — R079 Chest pain, unspecified: Secondary | ICD-10-CM

## 2021-10-08 DIAGNOSIS — F0390 Unspecified dementia without behavioral disturbance: Secondary | ICD-10-CM | POA: Diagnosis not present

## 2021-10-08 LAB — CBC
HCT: 41.3 % (ref 36.0–46.0)
Hemoglobin: 13 g/dL (ref 12.0–15.0)
MCH: 28.2 pg (ref 26.0–34.0)
MCHC: 31.5 g/dL (ref 30.0–36.0)
MCV: 89.6 fL (ref 80.0–100.0)
Platelets: 213 10*3/uL (ref 150–400)
RBC: 4.61 MIL/uL (ref 3.87–5.11)
RDW: 14.5 % (ref 11.5–15.5)
WBC: 6.6 10*3/uL (ref 4.0–10.5)
nRBC: 0 % (ref 0.0–0.2)

## 2021-10-08 LAB — TROPONIN I (HIGH SENSITIVITY): Troponin I (High Sensitivity): 3 ng/L (ref <18)

## 2021-10-08 LAB — BASIC METABOLIC PANEL
Anion gap: 7 (ref 5–15)
BUN: 17 mg/dL (ref 8–23)
CO2: 22 mmol/L (ref 22–32)
Calcium: 9.1 mg/dL (ref 8.9–10.3)
Chloride: 111 mmol/L (ref 98–111)
Creatinine, Ser: 0.75 mg/dL (ref 0.44–1.00)
GFR, Estimated: >60 mL/min (ref >60)
Glucose, Bld: 120 mg/dL — ABNORMAL HIGH (ref 70–99)
Potassium: 4 mmol/L (ref 3.5–5.1)
Sodium: 140 mmol/L (ref 135–145)

## 2021-10-08 LAB — PROTIME-INR
INR: 1.1 (ref 0.8–1.2)
Prothrombin Time: 14.4 seconds (ref 11.4–15.2)

## 2021-10-08 NOTE — ED Provider Notes (Signed)
[facility] Provider Note   Event Date/Time   First MD Initiated Contact with Patient 10/08/21 1613     (approximate) History  Chest Pain  HPI Belinda Gonzalez is a 87 y.o. female with a past medical history of significant dementia who presents with her caregiver from urgent care after an episode of chest pain.  Patient's caregiver is at bedside and states that she has had similar episodes in the past that have turned out to be gas pains and resolves after belching.  Patient did have an episode of belching prior to arrival and chest pain was resolved at that time.  Patient has not had any more complaints of chest pain since being transported to our emergency department.  Any further history and review of systems are unable to be obtained at this time secondary to patient's mental status however she does note that she is without pain at this time.   Physical Exam  Triage Vital Signs: ED Triage Vitals  Enc Vitals Group     BP 10/08/21 1513 116/69     Pulse Rate 10/08/21 1513 (!) 58     Resp 10/08/21 1513 16     Temp 10/08/21 1513 98 F (36.7 C)     Temp Source 10/08/21 1513 Oral     SpO2 10/08/21 1513 94 %     Weight 10/08/21 1514 185 lb (83.9 kg)     Height 10/08/21 1514 5\' 8"  (1.727 m)     Head Circumference --      Peak Flow --      Pain Score --      Pain Loc --      Pain Edu? --      Excl. in [place]? --    Most recent vital signs: Vitals:   10/08/21 1513  BP: 116/69  Pulse: (!) 58  Resp: 16  Temp: 98 F (36.7 C)  SpO2: 94%   General: Awake, oriented to self. CV:  Good peripheral perfusion.  Resp:  Normal effort.  Abd:  No distention.  Other:  Elderly overweight Caucasian female laying in bed in no acute distress ED Results / Procedures / Treatments  Labs (all labs ordered are listed, but only abnormal results are displayed) Labs Reviewed  BASIC METABOLIC PANEL - Abnormal; Notable for the following components:      Result Value   Glucose, Bld  120 (*)    All other components within normal limits  CBC  PROTIME-INR  TROPONIN I (HIGH SENSITIVITY)  TROPONIN I (HIGH SENSITIVITY)   EKG ED ECG REPORT I, Naaman Plummer, the attending physician, personally viewed and interpreted this ECG. Date: 10/08/2021 EKG Time: 1509 Rate: 59 Rhythm: normal sinus rhythm QRS Axis: normal Intervals: normal ST/T Wave abnormalities: normal Narrative Interpretation: no evidence of acute ischemia RADIOLOGY ED MD interpretation: 2 view chest x-ray interpreted by me shows no evidence of acute abnormalities including no pneumonia, pneumothorax, or widened mediastinum -Agree with radiology assessment Official radiology report(s): DG Chest 2 View  Result Date: 10/08/2021 CLINICAL DATA:  Chest pain EXAM: CHEST - 2 VIEW COMPARISON:  Chest x-ray 06/27/2017 FINDINGS: The heart size and mediastinal contours are within normal limits. Both lungs are clear. There are degenerative changes of the shoulders. IMPRESSION: No active cardiopulmonary disease. Electronically Signed   By: Ronney Asters M.D.   On: 10/08/2021 15:46   PROCEDURES: Critical Care performed: No .1-3 Lead EKG Interpretation  Performed by: Naaman Plummer, MD Authorized by: Valora Piccolo  K, MD     Interpretation: normal     ECG rate:  62   ECG rate assessment: normal     Rhythm: sinus rhythm     Ectopy: none     Conduction: normal    MEDICATIONS ORDERED IN ED: Medications - No data to display IMPRESSION / MDM / [place] / ED COURSE  I reviewed the triage vital signs and the nursing notes.                             The patient is on the cardiac monitor to evaluate for evidence of arrhythmia and/or significant heart rate changes. Patient's presentation is most consistent with acute presentation with potential threat to life or bodily function. Workup: ECG, CXR, CBC, BMP, Troponin Findings: ECG: No overt evidence of STEMI. No evidence of Brugadas sign, delta wave, epsilon  wave, significantly prolonged QTc, or malignant arrhythmia HS Troponin: Negative x1 Other Labs unremarkable for emergent problems. CXR: Without PTX, PNA, or widened mediastinum HEART Score: 3  Given History, Exam, and Workup I have low suspicion for ACS, Pneumothorax, Pneumonia, Pulmonary Embolus, Tamponade, Aortic Dissection or other emergent problem as a cause for this presentation.   Reassesment: Prior to discharge patients pain was controlled and they were well appearing.  Disposition:  Discharge. Strict return precautions discussed with patient with full understanding. Advised patient to follow up promptly with primary care provider    FINAL CLINICAL IMPRESSION(S) / ED DIAGNOSES   Final diagnoses:  Chest pain, unspecified type  Abdominal gas pain   Rx / DC Orders   ED Discharge Orders     None      Note:  This document was prepared using Dragon voice recognition software and may include unintentional dictation errors.   Naaman Plummer, MD 10/08/21 [phone]

## 2021-10-08 NOTE — ED Triage Notes (Signed)
Pt via POV sent by [facility] due to sudden onset CP during appointment today. Started around 3pm and pt's family states she clutched her chest and gasped for air, leaning forward and appearing acutely uncomfortable. Pt was begging for help at the time. Hx dementia, occasionally verbalizes information. Prior back surgeries, rotator cuff repair, HLD, thyroid disease, depression, HTN, and takes a baby aspirin for unknown heart condition. Pt in NAD on arrival to triage.

## 2022-04-16 ENCOUNTER — Observation Stay: Payer: Medicare Other

## 2022-04-16 ENCOUNTER — Emergency Department: Payer: Medicare Other

## 2022-04-16 ENCOUNTER — Inpatient Hospital Stay
Admission: EM | Admit: 2022-04-16 | Discharge: 2022-04-24 | DRG: 824 | Disposition: A | Discharge status: Hospice | Payer: Medicare Other | Attending: Student | Admitting: Student

## 2022-04-16 DIAGNOSIS — J449 Chronic obstructive pulmonary disease, unspecified: Secondary | ICD-10-CM | POA: Diagnosis present

## 2022-04-16 DIAGNOSIS — I480 Paroxysmal atrial fibrillation: Secondary | ICD-10-CM | POA: Diagnosis present

## 2022-04-16 DIAGNOSIS — H9190 Unspecified hearing loss, unspecified ear: Secondary | ICD-10-CM | POA: Diagnosis present

## 2022-04-16 DIAGNOSIS — I10 Essential (primary) hypertension: Secondary | ICD-10-CM | POA: Diagnosis present

## 2022-04-16 DIAGNOSIS — E876 Hypokalemia: Secondary | ICD-10-CM | POA: Diagnosis not present

## 2022-04-16 DIAGNOSIS — C8330 Diffuse large B-cell lymphoma, unspecified site: Secondary | ICD-10-CM

## 2022-04-16 DIAGNOSIS — R410 Disorientation, unspecified: Secondary | ICD-10-CM

## 2022-04-16 DIAGNOSIS — F0390 Unspecified dementia without behavioral disturbance: Secondary | ICD-10-CM

## 2022-04-16 DIAGNOSIS — Z7983 Long term (current) use of bisphosphonates: Secondary | ICD-10-CM

## 2022-04-16 DIAGNOSIS — Z7982 Long term (current) use of aspirin: Secondary | ICD-10-CM

## 2022-04-16 DIAGNOSIS — Z66 Do not resuscitate: Secondary | ICD-10-CM | POA: Diagnosis present

## 2022-04-16 DIAGNOSIS — C8333 Diffuse large B-cell lymphoma, intra-abdominal lymph nodes: Secondary | ICD-10-CM | POA: Diagnosis present

## 2022-04-16 DIAGNOSIS — N390 Urinary tract infection, site not specified: Secondary | ICD-10-CM | POA: Diagnosis present

## 2022-04-16 DIAGNOSIS — E860 Dehydration: Secondary | ICD-10-CM | POA: Diagnosis present

## 2022-04-16 DIAGNOSIS — Z79890 Hormone replacement therapy (postmenopausal): Secondary | ICD-10-CM

## 2022-04-16 DIAGNOSIS — F0284 Dementia in other diseases classified elsewhere, unspecified severity, with anxiety: Secondary | ICD-10-CM | POA: Diagnosis present

## 2022-04-16 DIAGNOSIS — B9689 Other specified bacterial agents as the cause of diseases classified elsewhere: Secondary | ICD-10-CM | POA: Diagnosis present

## 2022-04-16 DIAGNOSIS — M5136 Other intervertebral disc degeneration, lumbar region: Secondary | ICD-10-CM | POA: Diagnosis present

## 2022-04-16 DIAGNOSIS — Z7401 Bed confinement status: Secondary | ICD-10-CM

## 2022-04-16 DIAGNOSIS — Z888 Allergy status to other drugs, medicaments and biological substances status: Secondary | ICD-10-CM

## 2022-04-16 DIAGNOSIS — N3090 Cystitis, unspecified without hematuria: Secondary | ICD-10-CM | POA: Diagnosis present

## 2022-04-16 DIAGNOSIS — Z79899 Other long term (current) drug therapy: Secondary | ICD-10-CM

## 2022-04-16 DIAGNOSIS — M48061 Spinal stenosis, lumbar region without neurogenic claudication: Secondary | ICD-10-CM | POA: Diagnosis present

## 2022-04-16 DIAGNOSIS — F02818 Dementia in other diseases classified elsewhere, unspecified severity, with other behavioral disturbance: Secondary | ICD-10-CM | POA: Diagnosis present

## 2022-04-16 DIAGNOSIS — Z87891 Personal history of nicotine dependence: Secondary | ICD-10-CM

## 2022-04-16 DIAGNOSIS — F32A Depression, unspecified: Secondary | ICD-10-CM | POA: Diagnosis present

## 2022-04-16 DIAGNOSIS — F0283 Dementia in other diseases classified elsewhere, unspecified severity, with mood disturbance: Secondary | ICD-10-CM | POA: Diagnosis present

## 2022-04-16 DIAGNOSIS — G4700 Insomnia, unspecified: Secondary | ICD-10-CM | POA: Diagnosis present

## 2022-04-16 DIAGNOSIS — F05 Delirium due to known physiological condition: Secondary | ICD-10-CM | POA: Diagnosis present

## 2022-04-16 DIAGNOSIS — R590 Localized enlarged lymph nodes: Secondary | ICD-10-CM

## 2022-04-16 DIAGNOSIS — C8331 Diffuse large B-cell lymphoma, lymph nodes of head, face, and neck: Principal | ICD-10-CM | POA: Diagnosis present

## 2022-04-16 DIAGNOSIS — G309 Alzheimer's disease, unspecified: Secondary | ICD-10-CM | POA: Diagnosis present

## 2022-04-16 DIAGNOSIS — Z8673 Personal history of transient ischemic attack (TIA), and cerebral infarction without residual deficits: Secondary | ICD-10-CM

## 2022-04-16 DIAGNOSIS — E039 Hypothyroidism, unspecified: Secondary | ICD-10-CM | POA: Diagnosis present

## 2022-04-16 LAB — URINALYSIS, ROUTINE W REFLEX MICROSCOPIC
Bilirubin Urine: NEGATIVE
Glucose, UA: NEGATIVE mg/dL
Ketones, ur: NEGATIVE mg/dL
Nitrite: NEGATIVE
Protein, ur: 30 mg/dL — AB
Specific Gravity, Urine: 1.021 (ref 1.005–1.030)
WBC, UA: >50 WBC/hpf (ref 0–5)
pH: 5 (ref 5.0–8.0)

## 2022-04-16 LAB — TROPONIN I (HIGH SENSITIVITY): Troponin I (High Sensitivity): 10 ng/L (ref <18)

## 2022-04-16 LAB — COMPREHENSIVE METABOLIC PANEL
ALT: 10 U/L (ref 0–44)
AST: 14 U/L — ABNORMAL LOW (ref 15–41)
Albumin: 3.3 g/dL — ABNORMAL LOW (ref 3.5–5.0)
Alkaline Phosphatase: 79 U/L (ref 38–126)
Anion gap: 10 (ref 5–15)
BUN: 21 mg/dL (ref 8–23)
CO2: 23 mmol/L (ref 22–32)
Calcium: 9.6 mg/dL (ref 8.9–10.3)
Chloride: 105 mmol/L (ref 98–111)
Creatinine, Ser: 0.89 mg/dL (ref 0.44–1.00)
GFR, Estimated: >60 mL/min (ref >60)
Glucose, Bld: 117 mg/dL — ABNORMAL HIGH (ref 70–99)
Potassium: 3.8 mmol/L (ref 3.5–5.1)
Sodium: 138 mmol/L (ref 135–145)
Total Bilirubin: 1.1 mg/dL (ref 0.3–1.2)
Total Protein: 7.6 g/dL (ref 6.5–8.1)

## 2022-04-16 LAB — CBC WITH DIFFERENTIAL/PLATELET
Abs Immature Granulocytes: 0.04 10*3/uL (ref 0.00–0.07)
Basophils Absolute: 0 10*3/uL (ref 0.0–0.1)
Basophils Relative: 0 %
Eosinophils Absolute: 0.1 10*3/uL (ref 0.0–0.5)
Eosinophils Relative: 1 %
HCT: 39.5 % (ref 36.0–46.0)
Hemoglobin: 12 g/dL (ref 12.0–15.0)
Immature Granulocytes: 0 %
Lymphocytes Relative: 17 %
Lymphs Abs: 1.5 10*3/uL (ref 0.7–4.0)
MCH: 25.2 pg — ABNORMAL LOW (ref 26.0–34.0)
MCHC: 30.4 g/dL (ref 30.0–36.0)
MCV: 82.8 fL (ref 80.0–100.0)
Monocytes Absolute: 0.8 10*3/uL (ref 0.1–1.0)
Monocytes Relative: 9 %
Neutro Abs: 6.5 10*3/uL (ref 1.7–7.7)
Neutrophils Relative %: 73 %
Platelets: 363 10*3/uL (ref 150–400)
RBC: 4.77 MIL/uL (ref 3.87–5.11)
RDW: 15.9 % — ABNORMAL HIGH (ref 11.5–15.5)
WBC: 9 10*3/uL (ref 4.0–10.5)
nRBC: 0 % (ref 0.0–0.2)

## 2022-04-16 MED ORDER — SODIUM CHLORIDE 0.9% FLUSH
3.0000 mL | Freq: Two times a day (BID) | INTRAVENOUS | Status: DC
  Administered 2022-04-17 – 2022-04-23 (×13): 3 mL via INTRAVENOUS

## 2022-04-16 MED ORDER — HEPARIN SODIUM (PORCINE) 5000 UNIT/ML IJ SOLN
5000.0000 [IU] | Freq: Three times a day (TID) | INTRAMUSCULAR | Status: DC
  Administered 2022-04-17 – 2022-04-24 (×21): 5000 [IU] via SUBCUTANEOUS
  Filled 2022-04-16 (×21): qty 1

## 2022-04-16 MED ORDER — DULOXETINE HCL 30 MG PO CPEP
30.0000 mg | ORAL_CAPSULE | Freq: Every day | ORAL | Status: DC
  Administered 2022-04-17 – 2022-04-24 (×8): 30 mg via ORAL
  Filled 2022-04-16 (×10): qty 1

## 2022-04-16 MED ORDER — IOHEXOL 300 MG/ML  SOLN
100.0000 mL | Freq: Once | INTRAMUSCULAR | Status: AC | PRN
  Administered 2022-04-16: 100 mL via INTRAVENOUS

## 2022-04-16 MED ORDER — SODIUM CHLORIDE 0.9 % IV SOLN
1.0000 g | INTRAVENOUS | Status: DC
  Administered 2022-04-17 – 2022-04-20 (×4): 1 g via INTRAVENOUS
  Filled 2022-04-16 (×4): qty 10

## 2022-04-16 MED ORDER — SODIUM CHLORIDE 0.9 % IV SOLN
1.0000 g | Freq: Once | INTRAVENOUS | Status: AC
  Administered 2022-04-16: 1 g via INTRAVENOUS
  Filled 2022-04-16: qty 10

## 2022-04-16 MED ORDER — MEMANTINE HCL 5 MG PO TABS
10.0000 mg | ORAL_TABLET | Freq: Two times a day (BID) | ORAL | Status: DC
  Administered 2022-04-17 – 2022-04-24 (×15): 10 mg via ORAL
  Filled 2022-04-16 (×16): qty 2

## 2022-04-16 MED ORDER — ASPIRIN 81 MG PO TBEC
81.0000 mg | DELAYED_RELEASE_TABLET | Freq: Every day | ORAL | Status: DC
  Administered 2022-04-17 – 2022-04-24 (×8): 81 mg via ORAL
  Filled 2022-04-16 (×9): qty 1

## 2022-04-16 MED ORDER — ACETAMINOPHEN 650 MG RE SUPP: 650.0000 mg | Freq: Four times a day (QID) | RECTAL | Status: DC | PRN

## 2022-04-16 MED ORDER — ACETAMINOPHEN 325 MG PO TABS
650.0000 mg | ORAL_TABLET | Freq: Four times a day (QID) | ORAL | Status: DC | PRN
  Filled 2022-04-16: qty 2

## 2022-04-16 MED ORDER — PANTOPRAZOLE SODIUM 40 MG IV SOLR
40.0000 mg | Freq: Two times a day (BID) | INTRAVENOUS | Status: DC
  Administered 2022-04-17 – 2022-04-22 (×12): 40 mg via INTRAVENOUS
  Filled 2022-04-16 (×12): qty 10

## 2022-04-16 MED ORDER — SODIUM CHLORIDE 0.9 % IV SOLN: INTRAVENOUS | Status: AC

## 2022-04-16 MED ORDER — OXYBUTYNIN CHLORIDE 5 MG PO TABS
5.0000 mg | ORAL_TABLET | Freq: Two times a day (BID) | ORAL | Status: DC
  Administered 2022-04-17 – 2022-04-24 (×15): 5 mg via ORAL
  Filled 2022-04-16 (×18): qty 1

## 2022-04-16 MED ORDER — ROSUVASTATIN CALCIUM 10 MG PO TABS
10.0000 mg | ORAL_TABLET | Freq: Every day | ORAL | Status: DC
  Administered 2022-04-17 – 2022-04-23 (×8): 10 mg via ORAL
  Filled 2022-04-16 (×9): qty 1

## 2022-04-16 MED ORDER — SODIUM CHLORIDE 0.9 % IV BOLUS
1000.0000 mL | Freq: Once | INTRAVENOUS | Status: AC
  Administered 2022-04-16: 1000 mL via INTRAVENOUS

## 2022-04-16 MED ORDER — RIVASTIGMINE 4.6 MG/24HR TD PT24
4.6000 mg | MEDICATED_PATCH | Freq: Every day | TRANSDERMAL | Status: DC
  Administered 2022-04-17 – 2022-04-24 (×9): 4.6 mg via TRANSDERMAL
  Filled 2022-04-16 (×9): qty 1

## 2022-04-16 MED ORDER — LEVOTHYROXINE SODIUM 50 MCG PO TABS
75.0000 ug | ORAL_TABLET | Freq: Every day | ORAL | Status: DC
  Administered 2022-04-17 – 2022-04-24 (×8): 75 ug via ORAL
  Filled 2022-04-16 (×8): qty 1

## 2022-04-16 NOTE — ED Notes (Signed)
Patient cleaned from urine incontinence. Full linen change, chucks, and brief placed. MRI at the bedside for screening with family.

## 2022-04-16 NOTE — ED Provider Notes (Signed)
[facility] Provider Note    Event Date/Time   First MD Initiated Contact with Patient 04/16/22 1753     (approximate)   History   Nausea   HPI  SHAQUERIA WILMOTH is a 88 y.o. female who presents to the emergency department accompanied by caregiver.  Caregiver provides the history at bedside.  She states that other members of the household recently had a stomach bug.  Patient then started having some of that a few days ago.  Over the past 2 days they have noticed that the patient has become increasingly weak and confused.  Today she was not able to aid with any activities of daily living which is quite unlike her.  Additionally her oral intake has gone down drastically.  Caretaker is concerned for dehydration.  No fevers have been appreciated.     Physical Exam   Triage Vital Signs: ED Triage Vitals [04/16/22 1738]  Enc Vitals Group     BP 102/66     Pulse Rate 84     Resp 16     Temp 98.3 F (36.8 C)     Temp Source Oral     SpO2 98 %     Weight      Height      Head Circumference      Peak Flow      Pain Score      Pain Loc      Pain Edu?      Excl. in [place]?     Most recent vital signs: Vitals:   04/16/22 1738  BP: 102/66  Pulse: 84  Resp: 16  Temp: 98.3 F (36.8 C)  SpO2: 98%    General: Awake, alert, not oriented. CV:  Good peripheral perfusion. Regular rate and rhythm. Resp:  Normal effort. Lungs clear. Abd:  No distention. Tender to palpation in the left lower quadrant.    ED Results / Procedures / Treatments   Labs (all labs ordered are listed, but only abnormal results are displayed) Labs Reviewed  COMPREHENSIVE METABOLIC PANEL - Abnormal; Notable for the following components:      Result Value   Glucose, Bld 117 (*)    Albumin 3.3 (*)    AST 14 (*)    All other components within normal limits  CBC WITH DIFFERENTIAL/PLATELET - Abnormal; Notable for the following components:   MCH 25.2 (*)    RDW 15.9 (*)    All  other components within normal limits  URINALYSIS, ROUTINE W REFLEX MICROSCOPIC - Abnormal; Notable for the following components:   Color, Urine AMBER (*)    APPearance TURBID (*)    Hgb urine dipstick MODERATE (*)    Protein, ur 30 (*)    Leukocytes,Ua LARGE (*)    Bacteria, UA MANY (*)    All other components within normal limits  TROPONIN I (HIGH SENSITIVITY)     EKG  I, Phineas Semen, attending physician, personally viewed and interpreted this EKG  EKG Time: 1746 Rate: 75 Rhythm: sinus arrythmia Axis: left axis deviation Intervals: qtc 448 QRS: LAFB ST changes: no st elevation Impression: abnormal ekg   RADIOLOGY I independently interpreted and visualized the CT head. My interpretation: No bleed Radiology interpretation:  IMPRESSION:  1. No acute intracranial abnormality.  2. Mild diffuse atrophy and moderate chronic small vessel ischemic  change, progressed compared to prior exam.  3. Old lacunar infarcts in the left basal ganglia and right  cerebellum, new from prior exam.  I independently interpreted and visualized the CT abd/pel. My interpretation: No free air Radiology interpretation:  IMPRESSION:  1. Markedly enlarged left retroperitoneal lymph nodes measuring up  to 3.6 x 2.8 cm, highly suspicious for lymphoma or nodal metastatic  disease. No candidate primary mass identified in the abdomen or  pelvis.  2. Descending and sigmoid diverticulosis without evidence of acute  diverticulitis.  3. Calcified uterine fibroids.      PROCEDURES:  Critical Care performed: No    MEDICATIONS ORDERED IN ED: Medications - No data to display   IMPRESSION / MDM / ASSESSMENT AND PLAN / ED COURSE  I reviewed the triage vital signs and the nursing notes.                              Differential diagnosis includes, but is not limited to, infection, anemia, electrolyte abnormality, gastroenteritis  Patient's presentation is most consistent with acute  presentation with potential threat to life or bodily function.   The patient is on the cardiac monitor to evaluate for evidence of arrhythmia and/or significant heart rate changes.  Patient presented to the emergency department today companied by caregiver because of concerns for weakness.  On exam patient does appear dehydrated with cracked lips.  Patient is unable to give any history.  Blood work without concerning leukocytosis or electrolyte abnormality.  Did obtain a head CT which did not show any acute abnormality.  Patient was tender in the left lower quadrant so CT scan was obtained.  While this did not show any significant intra-abdominal infection did show some enlarged lymph nodes.  I discussed this with the caretaker.  UA is concerning for infection.  I do think this could find some of the patient's symptoms.  Will plan on starting IV fluids antibiotics here in the emergency department.  Discussed with Dr. Allena Katz with the hospitalist service who will plan on admission.      FINAL CLINICAL IMPRESSION(S) / ED DIAGNOSES   Final diagnoses:  Lower urinary tract infectious disease     Note:  This document was prepared using Dragon voice recognition software and may include unintentional dictation errors.    Phineas Semen, MD 04/16/22 2150

## 2022-04-16 NOTE — ED Notes (Signed)
Call floor, per RN pt is ok to come to the floor.

## 2022-04-16 NOTE — H&P (Signed)
History and Physical     PatientMarland Kitchen Belinda Gonzalez ZOX:[id number] DOB: [birth year] DOA: 04/16/2022 DOS: the patient was seen and examined on 04/17/2022 PCP: Marisue Ivan, MD   Patient coming from: Home  Chief Complaint: Confusion and disorientation  HISTORY OF PRESENT ILLNESS: Belinda Gonzalez is an 88 y.o. female brought by bedside for confusion more so than her usual baseline dementia.  Patient also reported to have nausea vomiting diarrhea for the past few days as the family had suffered a GI illness and upset stomach that the patient had but since then patient has been weak unable to stand and daughter has had to help her and patient has been 100% of full assist.  Patient is cooperative even though she has dementia is not agitated and no reports of behavioral disturbances. Patient has a history of atrial fibrillation, hypertension, hypothyroidism. Chart reviewed and discussed with the daughter that CT had reports stroke history and although we cannot tell timeframe it may be that she actually had a stroke when she had an episode of weakness and inability to move eat and ambulate.  Daughter-in-law said that she was expecting that her mom is probably going to be dehydrated and came in for fluids and did not realize that she may actually have had a stroke recently. I shared the CT imaging report with her and discussed with her that we will obtain an MRI of the brain as well.  As this is new onset mental status change.  Also discussed plan and evaluation.  Past Medical History:  Diagnosis Date   COPD (chronic obstructive pulmonary disease)    Depression    Hypertension    Thyroid disease    Review of Systems  Unable to perform ROS: Dementia   Allergies  Allergen Reactions   Lipitor [Atorvastatin]    Past Surgical History:  Procedure Laterality Date   CATARACT EXTRACTION     MEDICATIONS: Prior to Admission medications   Medication Sig Start Date End Date Taking? Authorizing  Provider  acetaminophen (TYLENOL) 500 MG tablet Take 1,000 mg by mouth 2 (two) times daily.   Yes [provider]  alendronate (FOSAMAX) 70 MG tablet Take 70 mg by mouth every 7 (seven) days. 03/23/17  Yes [provider]  aspirin EC 81 MG tablet Take 81 mg by mouth daily.   Yes [provider]  Cholecalciferol (VITAMIN D3) 50000 units TABS Take 50,000 Units by mouth every 7 (seven) days.   Yes [provider]  DULoxetine (CYMBALTA) 30 MG capsule Take 30 mg by mouth daily.   Yes [provider]  levothyroxine (SYNTHROID, LEVOTHROID) 75 MCG tablet Take 75 mcg by mouth daily before breakfast.    Yes [provider]  lisinopril (PRINIVIL,ZESTRIL) 10 MG tablet Take 10 mg by mouth 2 (two) times daily.    Yes [provider]  memantine (NAMENDA) 10 MG tablet Take 10 mg by mouth 2 (two) times daily.    Yes [provider]  Omega-3 Fatty Acids (FISH OIL) 1000 MG CAPS Take 1,000 mg by mouth 3 (three) times daily.   Yes [provider]  oxybutynin (DITROPAN) 5 MG tablet Take 5 mg by mouth 2 (two) times daily. 05/06/17  Yes [provider]  rivastigmine (EXELON) 4.6 mg/24hr Place 4.6 mg onto the skin daily. 02/28/22 04/29/22 Yes [provider]  rosuvastatin (CRESTOR) 10 MG tablet Take 10 mg by mouth at bedtime.    Yes [provider]  vitamin B-12 (CYANOCOBALAMIN) 1000 MCG  tablet Take 1,000 mcg by mouth daily.   Yes [provider]   ED Course: Pt in Ed arousable responsive cooperative afebrile O2 sats of 98%.  Vitals:   04/16/22 2300 04/16/22 2349 04/17/22 0012 04/17/22 0251  BP: (!) 122/95 (!) 148/72 (!) 155/68 (!) 155/68  Pulse: (!) 59 60 60 60  Resp: 18 10 16 16   Temp:   98.4 F (36.9 C) 98.4 F (36.9 C)  TempSrc:   Oral Oral  SpO2: 96% 98% 97%    No intake/output data recorded. SpO2: 97 % Blood work in ed shows: Glucose 117 albumin 3.3 AST of 14, normal CBC except for RDW of  15.9. Urinalysis shows large leukocytes nitrite negative more than 50 WBCs concerning for cystitis. EKG shows sinus arrhythmia at 75.  Left axis deviation QTc of 448 and no ST-T wave changes. Admission requested for urinary tract infection and confusion disorientation.  Results for orders placed or performed during the hospital encounter of 04/16/22 (from the past 72 hour(s))  Comprehensive metabolic panel     Status: Abnormal   Collection Time: 04/16/22  6:09 PM  Result Value Ref Range   Sodium 138 135 - 145 mmol/L   Potassium 3.8 3.5 - 5.1 mmol/L   Chloride 105 98 - 111 mmol/L   CO2 23 22 - 32 mmol/L   Glucose, Bld 117 (H) 70 - 99 mg/dL    Comment: Glucose reference range applies only to samples taken after fasting for at least 8 hours.   BUN 21 8 - 23 mg/dL   Creatinine, Ser 1.61 0.44 - 1.00 mg/dL   Calcium 9.6 8.9 - 09.6 mg/dL   Total Protein 7.6 6.5 - 8.1 g/dL   Albumin 3.3 (L) 3.5 - 5.0 g/dL   AST 14 (L) 15 - 41 U/L   ALT 10 0 - 44 U/L   Alkaline Phosphatase 79 38 - 126 U/L   Total Bilirubin 1.1 0.3 - 1.2 mg/dL   GFR, Estimated >04 >54 mL/min    Comment: (NOTE) Calculated using the CKD-EPI Creatinine Equation (2021)    Anion gap 10 5 - 15    Comment: Performed at [facility], [address] [place], [place] [address]  CBC with Differential     Status: Abnormal   Collection Time: 04/16/22  6:09 PM  Result Value Ref Range   WBC 9.0 4.0 - 10.5 K/uL   RBC 4.77 3.87 - 5.11 MIL/uL   Hemoglobin 12.0 12.0 - 15.0 g/dL   HCT 91.4 78.2 - 95.6 %   MCV 82.8 80.0 - 100.0 fL   MCH 25.2 (L) 26.0 - 34.0 pg   MCHC 30.4 30.0 - 36.0 g/dL   RDW 21.3 (H) 08.6 - 57.8 %   Platelets 363 150 - 400 K/uL   nRBC 0.0 0.0 - 0.2 %   Neutrophils Relative % 73 %   Neutro Abs 6.5 1.7 - 7.7 K/uL   Lymphocytes Relative 17 %   Lymphs Abs 1.5 0.7 - 4.0 K/uL   Monocytes Relative 9 %   Monocytes Absolute 0.8 0.1 - 1.0 K/uL   Eosinophils Relative 1 %   Eosinophils Absolute 0.1 0.0 - 0.5  K/uL   Basophils Relative 0 %   Basophils Absolute 0.0 0.0 - 0.1 K/uL   Immature Granulocytes 0 %   Abs Immature Granulocytes 0.04 0.00 - 0.07 K/uL    Comment: Performed at [facility], [address], [place], [place] [address]  Troponin I (High Sensitivity)  Status: None   Collection Time: 04/16/22  6:09 PM  Result Value Ref Range   Troponin I (High Sensitivity) 10 <18 ng/L    Comment: (NOTE) Elevated high sensitivity troponin I (hsTnI) values and significant  changes across serial measurements may suggest ACS but many other  chronic and acute conditions are known to elevate hsTnI results.  Refer to the "Links" section for chest pain algorithms and additional  guidance. Performed at [facility], [address] [place], [place] [address]   Urinalysis, Routine w reflex microscopic -Urine, Clean Catch     Status: Abnormal   Collection Time: 04/16/22  6:23 PM  Result Value Ref Range   Color, Urine AMBER (A) YELLOW    Comment: BIOCHEMICALS MAY BE AFFECTED BY COLOR   APPearance TURBID (A) CLEAR   Specific Gravity, Urine 1.021 1.005 - 1.030   pH 5.0 5.0 - 8.0   Glucose, UA NEGATIVE NEGATIVE mg/dL   Hgb urine dipstick MODERATE (A) NEGATIVE   Bilirubin Urine NEGATIVE NEGATIVE   Ketones, ur NEGATIVE NEGATIVE mg/dL   Protein, ur 30 (A) NEGATIVE mg/dL   Nitrite NEGATIVE NEGATIVE   Leukocytes,Ua LARGE (A) NEGATIVE   RBC / HPF 21-50 0 - 5 RBC/hpf   WBC, UA >50 0 - 5 WBC/hpf   Bacteria, UA MANY (A) NONE SEEN   Squamous Epithelial / HPF 6-10 0 - 5 /HPF   Mucus PRESENT     Comment: Performed at [facility], [address] [place], [place] [address]    Lab Results  Component Value Date   CREATININE 0.89 04/16/2022   CREATININE 0.75 10/08/2021   CREATININE 0.77 06/27/2017      Latest Ref Rng & Units 04/16/2022    6:09 PM 10/08/2021    3:22 PM 06/27/2017    4:13 PM  CMP  Glucose 70 - 99 mg/dL 098  119  147   BUN 8 - 23 mg/dL 21  17  16     Creatinine 0.44 - 1.00 mg/dL 8.29  5.62  1.30   Sodium 135 - 145 mmol/L 138  140  138   Potassium 3.5 - 5.1 mmol/L 3.8  4.0  4.0   Chloride 98 - 111 mmol/L 105  111  110   CO2 22 - 32 mmol/L 23  22  22    Calcium 8.9 - 10.3 mg/dL 9.6  9.1  9.3   Total Protein 6.5 - 8.1 g/dL 7.6     Total Bilirubin 0.3 - 1.2 mg/dL 1.1     Alkaline Phos 38 - 126 U/L 79     AST 15 - 41 U/L 14     ALT 0 - 44 U/L 10      Unresulted Labs (From admission, onward)     Start     Ordered   04/17/22 0500  Comprehensive metabolic panel  Tomorrow morning,   STAT        04/16/22 2300   04/17/22 0500  CBC  Tomorrow morning,   STAT        04/16/22 2300           Pt has received : Orders Placed This Encounter  Procedures   CT Head Wo Contrast    Standing Status:   Standing    Number of Occurrences:   1   CT ABDOMEN PELVIS W CONTRAST    Standing Status:   Standing    Number of Occurrences:   1    Order Specific Question:  Does the patient have a contrast media/X-ray dye allergy?    Answer:   No    Order Specific Question:   If indicated for the ordered procedure, I authorize the administration of contrast media per Radiology protocol    Answer:   Yes    Order Specific Question:   Is Oral Contrast requested for this exam?    Answer:   No oral contrast    Order Specific Question:   Reason for No Oral Contrast    Answer:   Medical necessity (Time Sensitive)   MR BRAIN WO CONTRAST    Standing Status:   Standing    Number of Occurrences:   1    Order Specific Question:   What is the patient's sedation requirement?    Answer:   No Sedation    Order Specific Question:   Does the patient have a pacemaker or implanted devices?    Answer:   No   Comprehensive metabolic panel    Standing Status:   Standing    Number of Occurrences:   1   CBC with Differential    Standing Status:   Standing    Number of Occurrences:   1   Urinalysis, Routine w reflex microscopic -Urine, Clean Catch    Standing Status:    Standing    Number of Occurrences:   1    Order Specific Question:   Specimen Source    Answer:   Urine, Clean Catch [76]   Comprehensive metabolic panel    Standing Status:   Standing    Number of Occurrences:   1   CBC    Standing Status:   Standing    Number of Occurrences:   1   Maintain IV access    Standing Status:   Standing    Number of Occurrences:   1   Vital signs    Standing Status:   Standing    Number of Occurrences:   1   Notify physician (specify)    Standing Status:   Standing    Number of Occurrences:   20    Order Specific Question:   Notify Physician    Answer:   for pulse less than 55 or greater than 120    Order Specific Question:   Notify Physician    Answer:   for respiratory rate less than 12 or greater than 25    Order Specific Question:   Notify Physician    Answer:   for temperature greater than 100.5 F    Order Specific Question:   Notify Physician    Answer:   for urinary output less than 30 mL/hr for four hours    Order Specific Question:   Notify Physician    Answer:   for systolic BP less than 90 or greater than 160, diastolic BP less than 60 or greater than 100    Order Specific Question:   Notify Physician    Answer:   for new hypoxia w/ oxygen saturations < 88%   Progressive Mobility Protocol: No Restrictions    Standing Status:   Standing    Number of Occurrences:   1   Daily weights    Standing Status:   Standing    Number of Occurrences:   1   Intake and Output    Standing Status:   Standing    Number of Occurrences:   1   Do not place and if present remove PureWick  Standing Status:   Standing    Number of Occurrences:   1   Initiate Oral Care Protocol    Standing Status:   Standing    Number of Occurrences:   1   Initiate Carrier Fluid Protocol    Standing Status:   Standing    Number of Occurrences:   1   RN may order General Admission PRN Orders utilizing "General Admission PRN medications" (through manage orders) for the  following patient needs: allergy symptoms (Claritin), cold sores (Carmex), cough (Robitussin DM), eye irritation (Liquifilm Tears), hemorrhoids (Tucks), indigestion (Maalox), minor skin irritation (Hydrocortisone Cream), muscle pain Romeo Apple Gay), nose irritation (saline nasal spray) and sore throat (Chloraseptic spray).    Standing Status:   Standing    Number of Occurrences:   [phone]   Swallow screen    Standing Status:   Standing    Number of Occurrences:   1   Do not attempt resuscitation (DNR)    Standing Status:   Standing    Number of Occurrences:   1    Order Specific Question:   If patient has no pulse and is not breathing    Answer:   Do Not Attempt Resuscitation    Order Specific Question:   If patient has a pulse and/or is breathing: Medical Treatment Goals    Answer:   LIMITED ADDITIONAL INTERVENTIONS: Use medication/IV fluids and cardiac monitoring as indicated; Do not use intubation or mechanical ventilation (DNI), also provide comfort medications.  Transfer to Progressive/Stepdown as indicated, avoid Intensive Care.    Order Specific Question:   Consent:    Answer:   Discussion documented in EHR or advanced directives reviewed   Consult to hospitalist    Standing Status:   Standing    Number of Occurrences:   1    Order Specific Question:   Place call to:    Answer:   hospitalist    Order Specific Question:   Reason for Consult    Answer:   Admit    Order Specific Question:   Diagnosis/Clinical Info for Consult:    Answer:   uti/weakness   Pulse oximetry check with vital signs    Standing Status:   Standing    Number of Occurrences:   1   Oxygen therapy Mode or (Route): Nasal cannula; Liters Per Minute: 2; Keep 02 saturation: greater than 92 %    Standing Status:   Standing    Number of Occurrences:   20    Order Specific Question:   Mode or (Route)    Answer:   Nasal cannula    Order Specific Question:   Liters Per Minute    Answer:   2    Order Specific Question:   Keep  02 saturation    Answer:   greater than 92 %   EKG 12-Lead    Standing Status:   Standing    Number of Occurrences:   1   EKG 12-Lead    Standing Status:   Standing    Number of Occurrences:   1   Place in observation (patient's expected length of stay will be less than 2 midnights)    Standing Status:   Standing    Number of Occurrences:   1    Order Specific Question:   Hospital Area    Answer:   [facility] [[id number]]    Order Specific Question:   Level of Care    Answer:   Med-Surg [  16]    Order Specific Question:   Covid Evaluation    Answer:   Asymptomatic - no recent exposure (last 10 days) testing not required    Order Specific Question:   Diagnosis    Answer:   Confusion and disorientation [[id number]]    Order Specific Question:   Admitting Physician    Answer:   Darrold Junker    Order Specific Question:   Attending Physician    Answer:   Darrold Junker   Aspiration precautions    Standing Status:   Standing    Number of Occurrences:   1   Fall precautions    Standing Status:   Standing    Number of Occurrences:   1    Meds ordered this encounter  Medications   sodium chloride 0.9 % bolus 1,000 mL   cefTRIAXone (ROCEPHIN) 1 g in sodium chloride 0.9 % 100 mL IVPB    Order Specific Question:   Antibiotic Indication:    Answer:   Other Indication (list below)   iohexol (OMNIPAQUE) 300 MG/ML solution 100 mL   cefTRIAXone (ROCEPHIN) 1 g in sodium chloride 0.9 % 100 mL IVPB    Order Specific Question:   Antibiotic Indication:    Answer:   Other Indication (list below)   heparin injection 5,000 Units   sodium chloride flush (NS) 0.9 % injection 3 mL   0.9 %  sodium chloride infusion   OR Linked Order Group    acetaminophen (TYLENOL) tablet 650 mg    acetaminophen (TYLENOL) suppository 650 mg   pantoprazole (PROTONIX) injection 40 mg   levothyroxine (SYNTHROID) tablet 75 mcg   memantine (NAMENDA) tablet 10 mg   oxybutynin (DITROPAN)  tablet 5 mg   rivastigmine (EXELON) 4.6 mg/24hr 4.6 mg   rosuvastatin (CRESTOR) tablet 10 mg   aspirin EC tablet 81 mg   DULoxetine (CYMBALTA) DR capsule 30 mg    Admission Imaging : MR BRAIN WO CONTRAST  Result Date: 04/17/2022 CLINICAL DATA:  Initial evaluation for suspected UTI. No other relevant history provided. EXAM: MRI HEAD WITHOUT CONTRAST TECHNIQUE: Multiplanar, multiecho pulse sequences of the brain and surrounding structures were obtained without intravenous contrast. COMPARISON:  CT from earlier the same day. FINDINGS: Brain: Diffuse prominence of the CSF containing spaces compatible with generalized cerebral atrophy, moderately advanced in nature. Patchy and confluent T2/FLAIR hyperintensity involving the periventricular deep white matter both cerebral hemispheres as well as the pons, consistent with chronic small vessel ischemic disease, moderate in nature. Small remote right cerebellar infarct. Additional remote lacunar infarct at the left thalamic capsular region. No evidence for acute or subacute ischemia. Gray-white matter differentiation maintained. No erosive chronic cortical infarction. No acute or chronic intracranial blood products. No mass lesion, midline shift or mass effect no hydrocephalus or extra-axial fluid collection. Pituitary gland and suprasellar region within normal limits. Vascular: Major intracranial vascular flow voids are maintained. Skull and upper cervical spine: Craniocervical junction within normal limits. Bone marrow signal intensity normal. Scalp soft tissues demonstrate no acute finding. Sinuses/Orbits: Prior bilateral ocular lens replacement. Paranasal sinuses are largely clear. No mastoid effusion. Other: None. IMPRESSION: 1. No acute intracranial abnormality. 2. Moderately advanced cerebral atrophy with chronic small vessel ischemic disease, with small remote infarcts involving the right cerebellum and left thalamocapsular region. Electronically Signed   By:  Rise Mu M.D.   On: 04/17/2022 01:02   CT ABDOMEN PELVIS W CONTRAST  Result Date: 04/16/2022 CLINICAL DATA:  Left lower quadrant  abdominal pain * Tracking Code: BO * EXAM: CT ABDOMEN AND PELVIS WITH CONTRAST TECHNIQUE: Multidetector CT imaging of the abdomen and pelvis was performed using the standard protocol following bolus administration of intravenous contrast. RADIATION DOSE REDUCTION: This exam was performed according to the departmental dose-optimization program which includes automated exposure control, adjustment of the mA and/or kV according to patient size and/or use of iterative reconstruction technique. CONTRAST:  OMNIPAQUE IOHEXOL 300 MG/ML  SOLN COMPARISON:  11/13/2006 FINDINGS: Lower chest: No acute abnormality.  Small hiatal hernia. Hepatobiliary: No solid liver abnormality is seen. No gallstones, gallbladder wall thickening, or biliary dilatation. Pancreas: Unremarkable. No pancreatic ductal dilatation or surrounding inflammatory changes. Spleen: Normal in size without significant abnormality. Adrenals/Urinary Tract: Adrenal glands are unremarkable. Kidneys are normal, without renal calculi, solid lesion, or hydronephrosis. Bladder is unremarkable. Stomach/Bowel: Stomach is within normal limits. Appendix appears normal. No evidence of bowel wall thickening, distention, or inflammatory changes. Descending and sigmoid diverticulosis. Vascular/Lymphatic: Aortic atherosclerosis. Markedly enlarged left retroperitoneal lymph nodes measuring up to 3.6 x 2.8 cm (series, image 36). Reproductive: Calcified uterine fibroids. Other: No abdominal wall hernia or abnormality. No ascites. Musculoskeletal: No acute or significant osseous findings. IMPRESSION: 1. Markedly enlarged left retroperitoneal lymph nodes measuring up to 3.6 x 2.8 cm, highly suspicious for lymphoma or nodal metastatic disease. No candidate primary mass identified in the abdomen or pelvis. 2. Descending and sigmoid  diverticulosis without evidence of acute diverticulitis. 3. Calcified uterine fibroids. Aortic Atherosclerosis (ICD10-I70.0). Electronically Signed   By: Jearld Lesch M.D.   On: 04/16/2022 19:46   CT Head Wo Contrast  Result Date: 04/16/2022 CLINICAL DATA:  Altered mental status, nausea and vomiting. History of dementia. EXAM: CT HEAD WITHOUT CONTRAST TECHNIQUE: Contiguous axial images were obtained from the base of the skull through the vertex without intravenous contrast. RADIATION DOSE REDUCTION: This exam was performed according to the departmental dose-optimization program which includes automated exposure control, adjustment of the mA and/or kV according to patient size and/or use of iterative reconstruction technique. COMPARISON:  Head CT 03/01/2012. FINDINGS: Brain: No evidence of acute infarction, hemorrhage, hydrocephalus, extra-axial collection or mass lesion/mass effect. There is mild diffuse atrophy and moderate periventricular and deep white matter hypodensity which likely represents chronic small vessel ischemic change. Findings have progressed compared to the prior study. There is an old lacunar infarct in the left basal ganglia which is new from prior exam. There is a small old infarct in the right cerebellum which is new from prior exam. Vascular: Atherosclerotic calcifications are present within the cavernous internal carotid arteries. Skull: Normal. Negative for fracture or focal lesion. Sinuses/Orbits: No acute finding. Other: None. IMPRESSION: 1. No acute intracranial abnormality. 2. Mild diffuse atrophy and moderate chronic small vessel ischemic change, progressed compared to prior exam. 3. Old lacunar infarcts in the left basal ganglia and right cerebellum, new from prior exam. Electronically Signed   By: Darliss Cheney M.D.   On: 04/16/2022 19:37   Physical Examination: Vitals:   04/16/22 2300 04/16/22 2349 04/17/22 0012 04/17/22 0251  BP: (!) 122/95 (!) 148/72 (!) 155/68 (!) 155/68   Pulse: (!) 59 60 60 60  Temp:   98.4 F (36.9 C) 98.4 F (36.9 C)  Resp: 18 10 16 16   SpO2: 96% 98% 97%   TempSrc:   Oral Oral   Physical Exam Vitals and nursing note reviewed.  Constitutional:      General: She is not in acute distress.    Appearance: She is ill-appearing.  She is not toxic-appearing or diaphoretic.  HENT:     Head: Normocephalic and atraumatic.     Right Ear: Hearing and external ear normal.     Left Ear: Hearing and external ear normal.     Nose: Nose normal. No nasal deformity.     Mouth/Throat:     Lips: Pink.     Mouth: Mucous membranes are moist.     Tongue: No lesions.     Pharynx: Oropharynx is clear.  Eyes:     Extraocular Movements: Extraocular movements intact.  Neck:     Vascular: No carotid bruit.  Cardiovascular:     Rate and Rhythm: Normal rate and regular rhythm.     Pulses: Normal pulses.     Heart sounds: Normal heart sounds.  Pulmonary:     Effort: Pulmonary effort is normal.     Breath sounds: Normal breath sounds.  Abdominal:     General: Bowel sounds are normal. There is no distension.     Palpations: Abdomen is soft. There is no mass.     Tenderness: There is no abdominal tenderness. There is no guarding.     Hernia: No hernia is present.  Musculoskeletal:     Right lower leg: No edema.     Left lower leg: No edema.  Skin:    General: Skin is warm.  Neurological:     General: No focal deficit present.     Mental Status: She is alert and oriented to person, place, and time.     Cranial Nerves: Cranial nerves 2-12 are intact.     Motor: Motor function is intact.  Psychiatric:        Attention and Perception: Attention normal.        Mood and Affect: Mood normal.        Speech: Speech normal.        Behavior: Behavior normal. Behavior is cooperative.        Cognition and Memory: Cognition normal.     Assessment and Plan: * Confusion and disorientation Differentials include secondary to infection i.e. or cystitis,  differentials also include TIA or CVA. Neurochecks, MRI of the brain. Swallow evaluation n.p.o. until then. Aspiration and fall precautions.   Cystitis Will continue patient on Rocephin started in the emergency room. Follow urine culture sensitivity and tailor to p.o. transition antibiotic.    Lumbar stenosis Patient has a history of chronic lumbar stenosis and degenerative disc disease. As needed Tylenol for pain.  Essential hypertension Vitals:   04/16/22 1738 04/16/22 1830 04/16/22 1900 04/16/22 1939  BP: 102/66 (!) 140/82 (!) 145/83 (!) 144/76   04/16/22 2000 04/16/22 2100 04/16/22 2200 04/16/22 2300  BP: (!) 163/71 131/75 (!) 143/58 (!) 122/95   04/16/22 2349 04/17/22 0012 04/17/22 0251  BP: (!) 148/72 (!) 155/68 (!) 155/68  Will continue patient on home regimen of lisinopril. Lab Results  Component Value Date   CREATININE 0.89 04/16/2022   CREATININE 0.75 10/08/2021   CREATININE 0.77 06/27/2017       DNR (do not resuscitate) DNR status verified with daughter at bedside.  Patient wishes to remain DO NOT RESUSCITATE.  Acquired hypothyroidism Will continue home regimen of levothyroxine at 75 p.o. if patient passes bedside swallow evaluation.    DVT prophylaxis:  Heparin  Code Status:  DNR    04/17/2022    2:00 AM  Advanced Directives  Does Patient Have a Medical Advance Directive? Yes  Type of Estate agent of [place]  Does patient want to make changes to medical advance directive? No - Patient declined  Copy of Healthcare Power of Attorney in Chart? No - copy requested    Family CommunicationJorene Guest: [phone]. Daughter-in-law Emergency Contact: Contact Information     Name Relation Home Work Mobile   ESTALENE, KALU [phone]     At[place]aya, Kliment   [phone]       Disposition Plan:  To be determined Consults: None Admission status: Inpatient Unit / Expected LOS: Med/tele 2 to 3  days.   Gertha Calkin MD [facility]  6 PM- 2 AM. [phone]( Pager ) Please use [email or web address] to contact the current [facility] MD  based on hours  and team and services or  You may call [phone] to contact current Assigned [facility] Attending/Consulting MD for this patient.  This number is the [facility] ADMIT/Consult system  and will be able to assist any patient in any [place] location .  Marland Kitchen

## 2022-04-16 NOTE — Hospital Course (Signed)
Admission for h/o dementia.  N/v/d and decreased po intake.

## 2022-04-16 NOTE — ED Triage Notes (Signed)
Pt BIB EMS for NVD 3 days. History of dementia but altered more than normal. Generalized weakness and more weak than normal per family. Hx a-fib.

## 2022-04-17 ENCOUNTER — Other Ambulatory Visit: Payer: Self-pay

## 2022-04-17 ENCOUNTER — Encounter: Payer: Self-pay | Admitting: Internal Medicine

## 2022-04-17 ENCOUNTER — Inpatient Hospital Stay: Payer: Medicare Other

## 2022-04-17 DIAGNOSIS — N390 Urinary tract infection, site not specified: Secondary | ICD-10-CM | POA: Diagnosis present

## 2022-04-17 DIAGNOSIS — F0284 Dementia in other diseases classified elsewhere, unspecified severity, with anxiety: Secondary | ICD-10-CM | POA: Diagnosis present

## 2022-04-17 DIAGNOSIS — I480 Paroxysmal atrial fibrillation: Secondary | ICD-10-CM | POA: Diagnosis present

## 2022-04-17 DIAGNOSIS — Z7401 Bed confinement status: Secondary | ICD-10-CM | POA: Diagnosis not present

## 2022-04-17 DIAGNOSIS — Z87891 Personal history of nicotine dependence: Secondary | ICD-10-CM | POA: Diagnosis not present

## 2022-04-17 DIAGNOSIS — Z79899 Other long term (current) drug therapy: Secondary | ICD-10-CM | POA: Diagnosis not present

## 2022-04-17 DIAGNOSIS — B9689 Other specified bacterial agents as the cause of diseases classified elsewhere: Secondary | ICD-10-CM | POA: Diagnosis present

## 2022-04-17 DIAGNOSIS — R590 Localized enlarged lymph nodes: Secondary | ICD-10-CM | POA: Diagnosis not present

## 2022-04-17 DIAGNOSIS — I10 Essential (primary) hypertension: Secondary | ICD-10-CM | POA: Diagnosis present

## 2022-04-17 DIAGNOSIS — E039 Hypothyroidism, unspecified: Secondary | ICD-10-CM | POA: Diagnosis present

## 2022-04-17 DIAGNOSIS — C8331 Diffuse large B-cell lymphoma, lymph nodes of head, face, and neck: Secondary | ICD-10-CM | POA: Diagnosis present

## 2022-04-17 DIAGNOSIS — F02818 Dementia in other diseases classified elsewhere, unspecified severity, with other behavioral disturbance: Secondary | ICD-10-CM | POA: Diagnosis present

## 2022-04-17 DIAGNOSIS — F32A Depression, unspecified: Secondary | ICD-10-CM | POA: Diagnosis present

## 2022-04-17 DIAGNOSIS — F0390 Unspecified dementia without behavioral disturbance: Secondary | ICD-10-CM

## 2022-04-17 DIAGNOSIS — Z8673 Personal history of transient ischemic attack (TIA), and cerebral infarction without residual deficits: Secondary | ICD-10-CM | POA: Diagnosis not present

## 2022-04-17 DIAGNOSIS — F0283 Dementia in other diseases classified elsewhere, unspecified severity, with mood disturbance: Secondary | ICD-10-CM | POA: Diagnosis present

## 2022-04-17 DIAGNOSIS — F03B0 Unspecified dementia, moderate, without behavioral disturbance, psychotic disturbance, mood disturbance, and anxiety: Secondary | ICD-10-CM | POA: Diagnosis not present

## 2022-04-17 DIAGNOSIS — E876 Hypokalemia: Secondary | ICD-10-CM | POA: Diagnosis not present

## 2022-04-17 DIAGNOSIS — Z7189 Other specified counseling: Secondary | ICD-10-CM | POA: Diagnosis not present

## 2022-04-17 DIAGNOSIS — Z79890 Hormone replacement therapy (postmenopausal): Secondary | ICD-10-CM | POA: Diagnosis not present

## 2022-04-17 DIAGNOSIS — Z66 Do not resuscitate: Secondary | ICD-10-CM | POA: Diagnosis present

## 2022-04-17 DIAGNOSIS — R410 Disorientation, unspecified: Secondary | ICD-10-CM | POA: Diagnosis not present

## 2022-04-17 DIAGNOSIS — J449 Chronic obstructive pulmonary disease, unspecified: Secondary | ICD-10-CM | POA: Diagnosis present

## 2022-04-17 DIAGNOSIS — E860 Dehydration: Secondary | ICD-10-CM | POA: Diagnosis present

## 2022-04-17 DIAGNOSIS — C8333 Diffuse large B-cell lymphoma, intra-abdominal lymph nodes: Secondary | ICD-10-CM | POA: Diagnosis present

## 2022-04-17 DIAGNOSIS — G309 Alzheimer's disease, unspecified: Secondary | ICD-10-CM | POA: Diagnosis present

## 2022-04-17 DIAGNOSIS — Z7982 Long term (current) use of aspirin: Secondary | ICD-10-CM | POA: Diagnosis not present

## 2022-04-17 DIAGNOSIS — G4700 Insomnia, unspecified: Secondary | ICD-10-CM | POA: Diagnosis present

## 2022-04-17 DIAGNOSIS — F05 Delirium due to known physiological condition: Secondary | ICD-10-CM | POA: Diagnosis present

## 2022-04-17 LAB — CBC
HCT: 36.2 % (ref 36.0–46.0)
Hemoglobin: 11.2 g/dL — ABNORMAL LOW (ref 12.0–15.0)
MCH: 25.1 pg — ABNORMAL LOW (ref 26.0–34.0)
MCHC: 30.9 g/dL (ref 30.0–36.0)
MCV: 81 fL (ref 80.0–100.0)
Platelets: 289 10*3/uL (ref 150–400)
RBC: 4.47 MIL/uL (ref 3.87–5.11)
RDW: 15.9 % — ABNORMAL HIGH (ref 11.5–15.5)
WBC: 7.2 10*3/uL (ref 4.0–10.5)
nRBC: 0 % (ref 0.0–0.2)

## 2022-04-17 LAB — COMPREHENSIVE METABOLIC PANEL
ALT: 10 U/L (ref 0–44)
AST: 13 U/L — ABNORMAL LOW (ref 15–41)
Albumin: 2.8 g/dL — ABNORMAL LOW (ref 3.5–5.0)
Alkaline Phosphatase: 72 U/L (ref 38–126)
Anion gap: 8 (ref 5–15)
BUN: 15 mg/dL (ref 8–23)
CO2: 24 mmol/L (ref 22–32)
Calcium: 8.9 mg/dL (ref 8.9–10.3)
Chloride: 108 mmol/L (ref 98–111)
Creatinine, Ser: 0.68 mg/dL (ref 0.44–1.00)
GFR, Estimated: >60 mL/min (ref >60)
Glucose, Bld: 95 mg/dL (ref 70–99)
Potassium: 3.6 mmol/L (ref 3.5–5.1)
Sodium: 140 mmol/L (ref 135–145)
Total Bilirubin: 0.9 mg/dL (ref 0.3–1.2)
Total Protein: 6.5 g/dL (ref 6.5–8.1)

## 2022-04-17 LAB — MAGNESIUM: Magnesium: 2 mg/dL (ref 1.7–2.4)

## 2022-04-17 LAB — PHOSPHORUS: Phosphorus: 2.6 mg/dL (ref 2.5–4.6)

## 2022-04-17 MED ORDER — LISINOPRIL 10 MG PO TABS
10.0000 mg | ORAL_TABLET | Freq: Two times a day (BID) | ORAL | Status: DC
  Administered 2022-04-17 – 2022-04-23 (×10): 10 mg via ORAL
  Filled 2022-04-17 (×14): qty 1

## 2022-04-17 MED ORDER — IOHEXOL 350 MG/ML SOLN
75.0000 mL | Freq: Once | INTRAVENOUS | Status: AC | PRN
  Administered 2022-04-17: 75 mL via INTRAVENOUS

## 2022-04-17 NOTE — Progress Notes (Addendum)
[facility] Progress Note  Patient: Belinda SkeetersClara T Gary    ZOX:[id number]  DOA: 04/16/2022     Date of Service: the patient was seen and examined on 04/17/2022  Chief Complaint  Patient presents with   Nausea   Brief hospital course: Belinda SkeetersClara T Bibbins is an 88 y.o. female with PMH of hypertension, paroxysmal A-fib, CVA, hypothyroid, COPD, Alzheimer's dementia as reviewed from EMR who [facility] with complaining of generalized weakness, decreased oral intake and ambulation for past 3 days.  Patient had nausea vomiting and diarrhea for 3 days which resolved and after that patient developed generalized weakness possible dehydration and feeling very weak, unable to do ADLs what she was doing at baseline.  Currently patient is bedbound unable to ambulate.  No behavioral issues. ED workup CT head and MRI brain shows chronic CVA, no acute findings. UA positive for UTI, CBC and CMP within normal range [facility] management as below.  Assessment and Plan:  UTI, UA positive Continue ceftriaxone 1 g IV daily Follow urine culture  Generalized weakness secondary to dehydration and recent acute gastroenteritis Continue gentle hydration with IV fluid and continue oral hydration CT head and MRI brain showed chronic CVA.  Patient does not have any focal deficits.  No acute findings on imaging Continue supportive care, aspiration precaution and fall precautions Ambulate with assistance. Follow PT/OT and TOC for placement  Retroperitoneal lymphadenopathy, possible lymphoma vs mets Patient will need lymph node biopsy for the purpose of diagnosis and management Patient may need CT chest Discussed with oncologist, workup will be discussed with family Follow oncology consult  HTN, proximal A-fib Resumed lisinopril 10 mg p.o. twice daily home dose Rate is controlled, intermittent bradycardia noticed Continue to monitor BP and titrate medications  accordingly  Chronic CVA, incidental finding on CT and MRI brain Continue aspirin and statin  Depression and Alzheimer's dementia, continued Namenda and Exelon patch Continued Cymbalta Hypothyroid, continued Synthroid  Body mass index is 27.81 kg/m.  Interventions:     Diet: Regular diet DVT Prophylaxis: Subcutaneous Heparin    Advance goals of care discussion: DNR  Family Communication: family was present at bedside, at the time of interview.  The pt provided permission to discuss medical plan with the family. Opportunity was given to ask question and all questions were answered satisfactorily.   Disposition:  Pt is from Home, admitted with generalized weakness and UTI, still has weakness, needs to be seen by PT/OT, which precludes a safe discharge. Discharge to most likely SNF, TBD after PT/OT eval. discharge most likely in 1 to 2 days  Subjective: No significant events overnight, patient has significant dementia AAO x 1 and very hard of hearing.  Seems to be resting comfortably, unable to offer any complaints.  Patient's daughter was at bedside, management plan discussed.  Physical Exam: General: NAD, lying comfortably Appear in no distress, affect appropriate Eyes: PERRLA ENT: Oral Mucosa Clear, moist  Neck: no JVD,  Cardiovascular: S1 and S2 Present, no Murmur,  Respiratory: good respiratory effort, Bilateral Air entry equal and Decreased, no Crackles, no wheezes Abdomen: Bowel Sound present, Soft and no tenderness,  Skin: no rashes Extremities: no Pedal edema, no calf tenderness Neurologic: without any new focal findings Gait not checked due to patient safety concerns  Vitals:   04/17/22 0427 04/17/22 0500 04/17/22 0824 04/17/22 0830  BP: 137/71  (!) 140/66   Pulse: (!) 58  (!) 51 (!) 58  Resp: 16  16   Temp: 98 F (36.7 C)  (!) 97.5 F (36.4 C)   TempSrc: Oral  Oral   SpO2: 98%  98%   Weight:  75.8 kg    Height:        Intake/Output Summary (Last 24  hours) at 04/17/2022 1315 Last data filed at 04/16/2022 1823 Gross per 24 hour  Intake --  Output 100 ml  Net -100 ml   Filed Weights   04/17/22 0012 04/17/22 0500  Weight: 75.6 kg 75.8 kg    Data Reviewed: I have personally reviewed and interpreted daily labs, tele strips, imagings as discussed above. I reviewed all nursing notes, pharmacy notes, vitals, pertinent old records I have discussed plan of care as described above with RN and patient/family.  CBC: Recent Labs  Lab 04/16/22 1809 04/17/22 0409  WBC 9.0 7.2  NEUTROABS 6.5  --   HGB 12.0 11.2*  HCT 39.5 36.2  MCV 82.8 81.0  PLT 363 289   Basic Metabolic Panel: Recent Labs  Lab 04/16/22 1809 04/17/22 0409  NA 138 140  K 3.8 3.6  CL 105 108  CO2 23 24  GLUCOSE 117* 95  BUN 21 15  CREATININE 0.89 0.68  CALCIUM 9.6 8.9  MG  --  2.0  PHOS  --  2.6    Studies: MR BRAIN WO CONTRAST  Result Date: 04/17/2022 CLINICAL DATA:  Initial evaluation for suspected UTI. No other relevant history provided. EXAM: MRI HEAD WITHOUT CONTRAST TECHNIQUE: Multiplanar, multiecho pulse sequences of the brain and surrounding structures were obtained without intravenous contrast. COMPARISON:  CT from earlier the same day. FINDINGS: Brain: Diffuse prominence of the CSF containing spaces compatible with generalized cerebral atrophy, moderately advanced in nature. Patchy and confluent T2/FLAIR hyperintensity involving the periventricular deep white matter both cerebral hemispheres as well as the pons, consistent with chronic small vessel ischemic disease, moderate in nature. Small remote right cerebellar infarct. Additional remote lacunar infarct at the left thalamic capsular region. No evidence for acute or subacute ischemia. Gray-white matter differentiation maintained. No erosive chronic cortical infarction. No acute or chronic intracranial blood products. No mass lesion, midline shift or mass effect no hydrocephalus or extra-axial fluid  collection. Pituitary gland and suprasellar region within normal limits. Vascular: Major intracranial vascular flow voids are maintained. Skull and upper cervical spine: Craniocervical junction within normal limits. Bone marrow signal intensity normal. Scalp soft tissues demonstrate no acute finding. Sinuses/Orbits: Prior bilateral ocular lens replacement. Paranasal sinuses are largely clear. No mastoid effusion. Other: None. IMPRESSION: 1. No acute intracranial abnormality. 2. Moderately advanced cerebral atrophy with chronic small vessel ischemic disease, with small remote infarcts involving the right cerebellum and left thalamocapsular region. Electronically Signed   By: Rise Mu M.D.   On: 04/17/2022 01:02   CT ABDOMEN PELVIS W CONTRAST  Result Date: 04/16/2022 CLINICAL DATA:  Left lower quadrant abdominal pain * Tracking Code: BO * EXAM: CT ABDOMEN AND PELVIS WITH CONTRAST TECHNIQUE: Multidetector CT imaging of the abdomen and pelvis was performed using the standard protocol following bolus administration of intravenous contrast. RADIATION DOSE REDUCTION: This exam was performed according to the departmental dose-optimization program which includes automated exposure control, adjustment of the mA and/or kV according to patient size and/or use of iterative reconstruction technique. CONTRAST:  OMNIPAQUE IOHEXOL 300 MG/ML  SOLN COMPARISON:  11/13/2006 FINDINGS: Lower chest: No acute abnormality.  Small hiatal hernia. Hepatobiliary: No solid liver abnormality is seen. No gallstones, gallbladder wall thickening, or biliary dilatation. Pancreas:  Unremarkable. No pancreatic ductal dilatation or surrounding inflammatory changes. Spleen: Normal in size without significant abnormality. Adrenals/Urinary Tract: Adrenal glands are unremarkable. Kidneys are normal, without renal calculi, solid lesion, or hydronephrosis. Bladder is unremarkable. Stomach/Bowel: Stomach is within normal limits. Appendix  appears normal. No evidence of bowel wall thickening, distention, or inflammatory changes. Descending and sigmoid diverticulosis. Vascular/Lymphatic: Aortic atherosclerosis. Markedly enlarged left retroperitoneal lymph nodes measuring up to 3.6 x 2.8 cm (series, image 36). Reproductive: Calcified uterine fibroids. Other: No abdominal wall hernia or abnormality. No ascites. Musculoskeletal: No acute or significant osseous findings. IMPRESSION: 1. Markedly enlarged left retroperitoneal lymph nodes measuring up to 3.6 x 2.8 cm, highly suspicious for lymphoma or nodal metastatic disease. No candidate primary mass identified in the abdomen or pelvis. 2. Descending and sigmoid diverticulosis without evidence of acute diverticulitis. 3. Calcified uterine fibroids. Aortic Atherosclerosis (ICD10-I70.0). Electronically Signed   By: Jearld Lesch M.D.   On: 04/16/2022 19:46   CT Head Wo Contrast  Result Date: 04/16/2022 CLINICAL DATA:  Altered mental status, nausea and vomiting. History of dementia. EXAM: CT HEAD WITHOUT CONTRAST TECHNIQUE: Contiguous axial images were obtained from the base of the skull through the vertex without intravenous contrast. RADIATION DOSE REDUCTION: This exam was performed according to the departmental dose-optimization program which includes automated exposure control, adjustment of the mA and/or kV according to patient size and/or use of iterative reconstruction technique. COMPARISON:  Head CT 03/01/2012. FINDINGS: Brain: No evidence of acute infarction, hemorrhage, hydrocephalus, extra-axial collection or mass lesion/mass effect. There is mild diffuse atrophy and moderate periventricular and deep white matter hypodensity which likely represents chronic small vessel ischemic change. Findings have progressed compared to the prior study. There is an old lacunar infarct in the left basal ganglia which is new from prior exam. There is a small old infarct in the right cerebellum which is new from  prior exam. Vascular: Atherosclerotic calcifications are present within the cavernous internal carotid arteries. Skull: Normal. Negative for fracture or focal lesion. Sinuses/Orbits: No acute finding. Other: None. IMPRESSION: 1. No acute intracranial abnormality. 2. Mild diffuse atrophy and moderate chronic small vessel ischemic change, progressed compared to prior exam. 3. Old lacunar infarcts in the left basal ganglia and right cerebellum, new from prior exam. Electronically Signed   By: Darliss Cheney M.D.   On: 04/16/2022 19:37    Scheduled Meds:  aspirin EC  81 mg Oral Daily   DULoxetine  30 mg Oral Daily   heparin  5,000 Units Subcutaneous Q8H   levothyroxine  75 mcg Oral Q0600   memantine  10 mg Oral BID   oxybutynin  5 mg Oral BID   pantoprazole (PROTONIX) IV  40 mg Intravenous Q12H   rivastigmine  4.6 mg Transdermal Daily   rosuvastatin  10 mg Oral QHS   sodium chloride flush  3 mL Intravenous Q12H   Continuous Infusions:  sodium chloride 50 mL/hr at 04/17/22 0036   cefTRIAXone (ROCEPHIN)  IV     PRN Meds: acetaminophen **OR** acetaminophen  Time spent: 35 minutes  Author: Gillis Santa. MD [facility] 04/17/2022 1:15 PM  To reach On-call, see care teams to locate the attending and reach out to them via [email or web address]. If 7PM-7AM, please contact night-coverage If you still have difficulty reaching the attending provider, please page the [facility] (Director on Call) for [facility] on amion for assistance.

## 2022-04-17 NOTE — Assessment & Plan Note (Signed)
DNR status verified with daughter at bedside.  Patient wishes to remain DO NOT RESUSCITATE.

## 2022-04-17 NOTE — Assessment & Plan Note (Signed)
Will continue home regimen of levothyroxine at 75 p.o. if patient passes bedside swallow evaluation.

## 2022-04-17 NOTE — Evaluation (Signed)
Physical Therapy Evaluation Patient Details Name: Belinda Gonzalez MRN: [id number] DOB: [birth year] Today's Date: 04/17/2022  History of Present Illness  Pt is an 88 y.o. female brought by bedside for confusion more so than her usual baseline dementia.  Patient also reported to have nausea vomiting diarrhea. PMH of COPD, depression, HTN, thyroid disease.   Clinical Impression  Pt alert, oriented to self only. Family in room provided PLOF; at baseline pt is ambulatory with 1 person handheld assist, needs assistance for all ADLs, no recent falls (1 incidence of pt sitting unsafely). Caregiver reported she has been assisting the patient for 6 years and is concerned about recent pt weakness and husband's inability to provide physical assistance.  The patient performed supine to sit with minA (handheld assist provided though may not have been required). Sit <> stand with minAx2 handheld assist for safety, progressed to minAx1 in standing and to ambulate ~110ft in the room. Intermittent posterior lean noted. Up in chair at end of session. Overall the patient demonstrated 1 person assistance for all mobility, further skilled PT intervention is recommended to maximize safety, function, and mobility.       Recommendations for follow up therapy are one component of a multi-disciplinary discharge planning process, led by the attending physician.  Recommendations may be updated based on patient status, additional functional criteria and insurance authorization.  Follow Up Recommendations       Assistance Recommended at Discharge Frequent or constant Supervision/Assistance  Patient can return home with the following  A little help with walking and/or transfers;Assistance with cooking/housework;Assist for transportation;Direct supervision/assist for medications management;Help with stairs or ramp for entrance;A little help with bathing/dressing/bathroom    Equipment Recommendations None recommended by PT   Recommendations for Other Services       Functional Status Assessment Patient has had a recent decline in their functional status and demonstrates the ability to make significant improvements in function in a reasonable and predictable amount of time.     Precautions / Restrictions Precautions Precautions: Fall Restrictions Weight Bearing Restrictions: No      Mobility  Bed Mobility Overal bed mobility: Needs Assistance Bed Mobility: Supine to Sit     Supine to sit: HOB elevated, Min assist     General bed mobility comments: provided with handheld assist, not necessarily required    Transfers Overall transfer level: Needs assistance Equipment used: 1 person hand held assist, 2 person hand held assist Transfers: Sit to/from Stand, Bed to chair/wheelchair/BSC Sit to Stand: Min assist   Step pivot transfers: Min assist            Ambulation/Gait Ambulation/Gait assistance: Min assist Gait Distance (Feet): 10 Feet Assistive device: 1 person hand held assist         General Gait Details: intermittent posterior lean noted  Stairs            Wheelchair Mobility    Modified Rankin (Stroke Patients Only)       Balance Overall balance assessment: Needs assistance Sitting-balance support: Feet supported Sitting balance-Leahy Scale: Good     Standing balance support: Bilateral upper extremity supported Standing balance-Leahy Scale: Poor                               Pertinent Vitals/Pain Pain Assessment Pain Assessment: No/denies pain    Home Living Family/patient expects to be discharged to:: Private residence Living Arrangements: Spouse/significant other;Other relatives Available Help at Discharge: Family;Available PRN/intermittently  Type of Home: House Home Access: Stairs to enter   [employer] of Steps: 4     Home Equipment: Agricultural consultant (2 wheels);Shower seat      Prior Function Prior Level of Function :  Needs assist       Physical Assist : Mobility (physical);ADLs (physical) Mobility (physical): Bed mobility;Transfers;Gait;Stairs ADLs (physical): Grooming;Bathing;Dressing;Toileting;IADLs Mobility Comments: per family DME is a fall risk for her; does not use RW or cane, 1 person handheld assist       Hand Dominance        Extremity/Trunk Assessment   Upper Extremity Assessment Upper Extremity Assessment: Defer to OT evaluation    Lower Extremity Assessment Lower Extremity Assessment: Generalized weakness       Communication      Cognition Arousal/Alertness: Awake/alert Behavior During Therapy: WFL for tasks assessed/performed Overall Cognitive Status: History of cognitive impairments - at baseline                                 General Comments: pt followed all one step commands        General Comments      Exercises     Assessment/Plan    PT Assessment Patient needs continued PT services  PT Problem List Decreased mobility;Decreased activity tolerance       PT Treatment Interventions DME instruction;Therapeutic activities;Therapeutic exercise;Gait training;Patient/family education;Stair training;Balance training;Neuromuscular re-education;Functional mobility training    PT Goals (Current goals can be found in the Care Plan section)  Acute Rehab PT Goals Patient Stated Goal: to improve function PT Goal Formulation: With family Time For Goal Achievement: 05/01/22 Potential to Achieve Goals: Good    Frequency Min 2X/week     Co-evaluation PT/OT/SLP Co-Evaluation/Treatment: Yes Reason for Co-Treatment: To address functional/ADL transfers PT goals addressed during session: Mobility/safety with mobility;Balance         AM-PAC PT "6 Clicks" Mobility  Outcome Measure Help needed turning from your back to your side while in a flat bed without using bedrails?: A Little Help needed moving from lying on your back to sitting on the side of  a flat bed without using bedrails?: A Little Help needed moving to and from a bed to a chair (including a wheelchair)?: A Little Help needed standing up from a chair using your arms (e.g., wheelchair or bedside chair)?: A Little Help needed to walk in hospital room?: A Little Help needed climbing 3-5 steps with a railing? : A Little 6 Click Score: 18    End of Session   Activity Tolerance: Patient tolerated treatment well Patient left: in chair;with chair alarm set;with call bell/phone within reach;with family/visitor present Nurse Communication: Mobility status PT Visit Diagnosis: Other abnormalities of gait and mobility (R26.89);Muscle weakness (generalized) (M62.81)    Time: 3709-6438 PT Time Calculation (min) (ACUTE ONLY): 15 min   Charges:   PT Evaluation $PT Eval Low Complexity: 1 Low          Olga Coaster PT, DPT 3:31 PM,04/17/22

## 2022-04-17 NOTE — Assessment & Plan Note (Signed)
Vitals:   04/16/22 1738 04/16/22 1830 04/16/22 1900 04/16/22 1939  BP: 102/66 (!) 140/82 (!) 145/83 (!) 144/76   04/16/22 2000 04/16/22 2100 04/16/22 2200 04/16/22 2300  BP: (!) 163/71 131/75 (!) 143/58 (!) 122/95   04/16/22 2349 04/17/22 0012 04/17/22 0251  BP: (!) 148/72 (!) 155/68 (!) 155/68  Will continue patient on home regimen of lisinopril. Lab Results  Component Value Date   CREATININE 0.89 04/16/2022   CREATININE 0.75 10/08/2021   CREATININE 0.77 06/27/2017

## 2022-04-17 NOTE — Evaluation (Signed)
Occupational Therapy Evaluation Patient Details Name: Belinda Gonzalez MRN: [id number] DOB: Jun 22, 1934 Today's Date: 04/17/2022   History of Present Illness Pt is an 88 y.o. female brought by bedside for confusion more so than her usual baseline dementia.  Patient also reported to have nausea vomiting diarrhea. PMH of COPD, depression, HTN, thyroid disease.   Clinical Impression   Ms Heater was seen for OT/PT co-evaluation this date. Prior to hospital admission, pt required +1 asssit for all mobility and ADLs. Pt lives with spouse, has been receiving aid from caregiver for ~6 years. Pt presents to acute OT demonstrating near baseline performance for ADLs and mobility. Pt currently requires MIN A don B socks seated EOB. MIN A + HHA sit<>stand and in room mobility, +2 for safety. Upon hospital discharge, recommend follow up therapy with increased assistance at home.    Recommendations for follow up therapy are one component of a multi-disciplinary discharge planning process, led by the attending physician.  Recommendations may be updated based on patient status, additional functional criteria and insurance authorization.   Assistance Recommended at Discharge Frequent or constant Supervision/Assistance  Patient can return home with the following A little help with walking and/or transfers;A little help with bathing/dressing/bathroom;Help with stairs or ramp for entrance    Functional Status Assessment  Patient has had a recent decline in their functional status and demonstrates the ability to make significant improvements in function in a reasonable and predictable amount of time.  Equipment Recommendations  BSC/3in1    Recommendations for Other Services       Precautions / Restrictions Precautions Precautions: Fall Restrictions Weight Bearing Restrictions: No      Mobility Bed Mobility Overal bed mobility: Needs Assistance Bed Mobility: Supine to Sit     Supine to sit: HOB  elevated, Min guard     General bed mobility comments: provided with handheld assist, not necessarily required    Transfers Overall transfer level: Needs assistance Equipment used: 1 person hand held assist, 2 person hand held assist Transfers: Sit to/from Stand, Bed to chair/wheelchair/BSC Sit to Stand: Min assist     Step pivot transfers: Min assist            Balance Overall balance assessment: Needs assistance Sitting-balance support: Feet supported Sitting balance-Leahy Scale: Good     Standing balance support: Bilateral upper extremity supported Standing balance-Leahy Scale: Poor Standing balance comment: posterior lean                           ADL either performed or assessed with clinical judgement   ADL Overall ADL's : Needs assistance/impaired                                       General ADL Comments: MIN A don B socks seated EOB. MIN A + HHA for simulated toilet t/f, +2 for safety.      Pertinent Vitals/Pain Pain Assessment Pain Assessment: No/denies pain     Hand Dominance     Extremity/Trunk Assessment Upper Extremity Assessment Upper Extremity Assessment: Generalized weakness   Lower Extremity Assessment Lower Extremity Assessment: Generalized weakness       Communication Communication Communication: HOH   Cognition Arousal/Alertness: Awake/alert Behavior During Therapy: WFL for tasks assessed/performed Overall Cognitive Status: History of cognitive impairments - at baseline  General Comments: followed 1 step commands with cues                Home Living Family/patient expects to be discharged to:: Private residence Living Arrangements: Spouse/significant other;Other relatives Available Help at Discharge: Family;Available PRN/intermittently Type of Home: House Home Access: Stairs to enter [employer] of Steps: 4         Bathroom  Shower/Tub: Walk-in shower         Home Equipment: Agricultural consultant (2 wheels);Shower seat          Prior Functioning/Environment Prior Level of Function : Needs assist       Physical Assist : Mobility (physical);ADLs (physical) Mobility (physical): Bed mobility;Transfers;Gait;Stairs ADLs (physical): Grooming;Bathing;Dressing;Toileting;IADLs Mobility Comments: per family DME is a fall risk for her; does not use RW or cane, 1 person handheld assist ADLs Comments: per caregiver - pt requires +1 assistance for all ADLs at baseline        OT Problem List: Impaired balance (sitting and/or standing);Decreased safety awareness      OT Treatment/Interventions: Self-care/ADL training;Therapeutic exercise;Energy conservation;DME and/or AE instruction;Cognitive remediation/compensation;Therapeutic activities;Balance training;Patient/family education    OT Goals(Current goals can be found in the care plan section) Acute Rehab OT Goals Patient Stated Goal: to go to rehab OT Goal Formulation: With family Time For Goal Achievement: 05/01/22 Potential to Achieve Goals: Fair ADL Goals Pt Will Perform Grooming: with set-up;with supervision;sitting Pt Will Perform Lower Body Dressing: with min assist;with caregiver independent in assisting;sit to/from stand Pt Will Transfer to Toilet: with min assist;ambulating;regular height toilet  OT Frequency: Min 1X/week    Co-evaluation PT/OT/SLP Co-Evaluation/Treatment: Yes Reason for Co-Treatment: To address functional/ADL transfers PT goals addressed during session: Mobility/safety with mobility;Balance        AM-PAC OT "6 Clicks" Daily Activity     Outcome Measure Help from another person eating meals?: A Little Help from another person taking care of personal grooming?: A Little Help from another person toileting, which includes using toliet, bedpan, or urinal?: A Little Help from another person bathing (including washing, rinsing, drying)?:  A Little Help from another person to put on and taking off regular upper body clothing?: A Little Help from another person to put on and taking off regular lower body clothing?: A Little 6 Click Score: 18   End of Session    Activity Tolerance: Patient tolerated treatment well Patient left: in chair;with call bell/phone within reach;with chair alarm set;with family/visitor present  OT Visit Diagnosis: Other abnormalities of gait and mobility (R26.89);Muscle weakness (generalized) (M62.81)                Time: 6606-3016 OT Time Calculation (min): 14 min Charges:  OT General Charges $OT Visit: 1 Visit OT Evaluation $OT Eval Low Complexity: 1 Low  Kathie Dike, M.S. OTR/L  04/17/22, 4:31 PM  ascom [phone]

## 2022-04-17 NOTE — Assessment & Plan Note (Signed)
Differentials include secondary to infection i.e. or cystitis, differentials also include TIA or CVA. Neurochecks, MRI of the brain. Swallow evaluation n.p.o. until then. Aspiration and fall precautions.

## 2022-04-17 NOTE — TOC Initial Note (Signed)
Transition of Care [facility]) - Initial/Assessment Note    Patient Details  Name: Belinda Gonzalez MRN: [id number] Date of Birth: [birth year]  [facility]) CM/SW Contact:    Allena Katz, LCSW Phone Number: 04/17/2022, 3:10 PM  Clinical Narrative:      CSW unable to assess pt in with oncology. PT states they are recommending HH for pt. PT states pt may be more appropriate for ALF. CSW to discuss with patient. Pt has an aide through her ex DIL and may be requesting additional aid services. Will talk to patient about this.          Patient Goals and CMS Choice            Expected Discharge Plan and Services                                              Prior Living Arrangements/Services                       Activities of Daily Living Home Assistive Devices/Equipment: None ADL Screening (condition at time of admission) Patient's cognitive ability adequate to safely complete daily activities?: No Is the patient deaf or have difficulty hearing?: Yes Does the patient have difficulty seeing, even when wearing glasses/contacts?: No Does the patient have difficulty concentrating, remembering, or making decisions?: Yes Patient able to express need for assistance with ADLs?: Yes Does the patient have difficulty dressing or bathing?: Yes Independently performs ADLs?: No Communication: Independent Dressing (OT): Needs assistance Is this a change from baseline?: Pre-admission baseline Grooming: Needs assistance Is this a change from baseline?: Pre-admission baseline Feeding: Independent Bathing: Needs assistance Is this a change from baseline?: Pre-admission baseline Toileting: Needs assistance Is this a change from baseline?: Pre-admission baseline In/Out Bed: Needs assistance Is this a change from baseline?: Pre-admission baseline Walks in Home: Needs assistance Is this a change from baseline?: Pre-admission baseline Does the patient have  difficulty walking or climbing stairs?: Yes Weakness of Legs: Both Weakness of Arms/Hands: None  Permission Sought/Granted                  Emotional Assessment              Admission diagnosis:  Lower urinary tract infectious disease [N39.0] Confusion and disorientation [R41.0] Patient Active Problem List   Diagnosis Date Noted   Retroperitoneal lymphadenopathy 04/17/2022   Dementia without behavioral disturbance, psychotic disturbance, mood disturbance, or anxiety 04/17/2022   Acquired hypothyroidism 04/16/2022   Essential hypertension 04/16/2022   Confusion and disorientation 04/16/2022   Cystitis 04/16/2022   DNR (do not resuscitate) 01/15/2021   Lumbar stenosis 10/17/2013   PCP:  Marisue Ivan, MD Pharmacy:   [facility] [phone] - [place], [place] - [address] [place] [address] Phone: [phone] Fax: [phone]     Social Determinants of Health (SDOH) Social History: SDOH Screenings   Food Insecurity: No Food Insecurity (04/17/2022)  Housing: Low Risk  (04/17/2022)  Transportation Needs: No Transportation Needs (04/17/2022)  Utilities: Not At Risk (04/17/2022)  Tobacco Use: Medium Risk (04/17/2022)   SDOH Interventions:     Readmission Risk Interventions     No data to display

## 2022-04-17 NOTE — Progress Notes (Signed)
PT Cancellation Note  Patient Details Name: Belinda Gonzalez MRN: [id number] DOB: [birth year]   Cancelled Treatment:    Reason Eval/Treat Not Completed: Other (comment). Pt sleeping soundly, PT to re-attempt as able.    Olga Coaster PT, DPT 10:04 AM,04/17/22

## 2022-04-17 NOTE — Assessment & Plan Note (Signed)
Patient has a history of chronic lumbar stenosis and degenerative disc disease. As needed Tylenol for pain.

## 2022-04-17 NOTE — Assessment & Plan Note (Signed)
Will continue patient on Rocephin started in the emergency room. Follow urine culture sensitivity and tailor to p.o. transition antibiotic.

## 2022-04-17 NOTE — Consult Note (Signed)
[facility]  [phone] Fax:([phone]) [phone]  ID: Belinda Gonzalez OB: November 29, 1934  MR#: [id number]  CSN#:[id number]  Patient Care Team: Marisue Ivan, MD as PCP - General (Family Medicine)  REFERRING PROVIDER: Dr. Lucianne Muss  REASON FOR REFERRAL: Left retroperitoneal lymphadenopathy   ASSESSMENT AND PLAN:   Belinda Gonzalez is a 88 y.o. female with pmh of COPD, depression, hypertension tension and advanced dementia presented to [facility] ED on 04/16/2022 with concerns of diarrhea, nausea, generalized weakness for the past few days.   # Retroperitoneal lymphadenopathy -CT abdomen pelvis done for diarrhea showed incidental finding of markedly enlarged left retroperitoneal lymph node measuring up to 3.6 x 2.8 cm.  Highly suspicious for lymphoma or nodal metastatic disease.  No primary mass identified in the abdomen or pelvis.  -I discussed with the patient's ex daughter-in-law/caregiver at bedside and her husband Gala Romney POA over the phone about the CT imaging findings.  The lymph node is looking suspicious for a cancerous process but it can only be confirmed on biopsy.  If the biopsy result does show cancer, considering her advanced age, advanced dementia and poor functional status she would not be a candidate for systemic cancer treatment.  This raises a question if it is necessary to even biopsy the lymph node.  I recommended to obtain CT chest for staging and to see if there is more accessible lymph node or a lesion that could be biopsied.  Patient's husband would like to proceed with CT chest.  At this time he is unable to make any decisions about biopsy.  Patient's daughter-in-law will talk to the daughter about the situation.  I will follow-up with them again tomorrow about the biopsy plan.  If they do decide to proceed with biopsy, they prefer inpatient.   # Advanced dementia # Chronic stroke -on aspirin and statin # A-fib # Hypertension # Hypothyroidism  Rest as  per primary team.  Patient expressed understanding and was in agreement with this plan. She also understands that She can call clinic at any time with any questions, concerns, or complaints.   I spent a total of 60 minutes reviewing chart data, face-to-face evaluation with the patient, counseling and coordination of care as detailed above.  HPI: Belinda Gonzalez is a 88 y.o. female with past medical history of COPD, depression, hypertension tension and advanced dementia presented to [facility] ED on 04/16/2022 with concerns of diarrhea, nausea, generalized weakness for the past few days.  Her diarrhea had resolved but overall she was feeling very weak and possibly dehydrated.  UA positive for UTI.  Urine culture is pending.  She is on IV fluids and ceftriaxone.  She is also more confused than baseline.  CT head and MRI brain showed 2 remote strokes.  CT abdomen pelvis showed incidental finding of markedly enlarged left retroperitoneal lymph node measuring up to 3.6 x 2.8 cm.  Highly suspicious for lymphoma or nodal metastatic disease.  No primary mass identified in the abdomen or pelvis.  All information was obtained from patient's ex daughter-in-law and caregiver.  Patient has advanced dementia and is not able to participate in conversation.  She is not oriented to self, place or person.  She needs assistance with all her ADLs.  Husband is POA.  REVIEW OF SYSTEMS:   ROS  As per HPI. Otherwise, a complete review of systems is negative.  PAST MEDICAL HISTORY: Past Medical History:  Diagnosis Date   COPD (chronic obstructive pulmonary disease)    Depression  Hypertension    Thyroid disease     PAST SURGICAL HISTORY: Past Surgical History:  Procedure Laterality Date   CATARACT EXTRACTION      FAMILY HISTORY: History reviewed. No pertinent family history.  HEALTH MAINTENANCE: Social History   Tobacco Use   Smoking status: Former    Types: Cigarettes    Quit date: 01/07/1999    Years  since quitting: 23.2   Smokeless tobacco: Never  Substance Use Topics   Alcohol use: Not Currently   Drug use: Not Currently     Allergies  Allergen Reactions   Lipitor [Atorvastatin]     Current Facility-Administered Medications  Medication Dose Route Frequency Provider Last Rate Last Admin   0.9 %  sodium chloride infusion   Intravenous Continuous Gertha Calkin, MD 50 mL/hr at 04/17/22 0036 New Bag at 04/17/22 0036   acetaminophen (TYLENOL) tablet 650 mg  650 mg Oral Q6H PRN Gertha Calkin, MD       Or   acetaminophen (TYLENOL) suppository 650 mg  650 mg Rectal Q6H PRN Gertha Calkin, MD       aspirin EC tablet 81 mg  81 mg Oral Daily Irena Cords V, MD   81 mg at 04/17/22 1008   cefTRIAXone (ROCEPHIN) 1 g in sodium chloride 0.9 % 100 mL IVPB  1 g Intravenous Q24H Irena Cords V, MD       DULoxetine (CYMBALTA) DR capsule 30 mg  30 mg Oral Daily Irena Cords V, MD   30 mg at 04/17/22 1008   heparin injection 5,000 Units  5,000 Units Subcutaneous Q8H Gertha Calkin, MD   5,000 Units at 04/17/22 0018   levothyroxine (SYNTHROID) tablet 75 mcg  75 mcg Oral Q0600 Gertha Calkin, MD   75 mcg at 04/17/22 0614   lisinopril (ZESTRIL) tablet 10 mg  10 mg Oral BID Gillis Santa, MD       memantine [facility]) tablet 10 mg  10 mg Oral BID Irena Cords V, MD   10 mg at 04/17/22 1007   oxybutynin (DITROPAN) tablet 5 mg  5 mg Oral BID Irena Cords V, MD   5 mg at 04/17/22 1008   pantoprazole (PROTONIX) injection 40 mg  40 mg Intravenous Q12H Irena Cords V, MD   40 mg at 04/17/22 1008   rivastigmine (EXELON) 4.6 mg/24hr 4.6 mg  4.6 mg Transdermal Daily Irena Cords V, MD   4.6 mg at 04/17/22 1014   rosuvastatin (CRESTOR) tablet 10 mg  10 mg Oral QHS Irena Cords V, MD   10 mg at 04/17/22 0020   sodium chloride flush (NS) 0.9 % injection 3 mL  3 mL Intravenous Q12H Irena Cords V, MD   3 mL at 04/17/22 1004    OBJECTIVE: Vitals:   04/17/22 0824 04/17/22 0830  BP: (!) 140/66   Pulse: (!) 51 (!) 58  Resp: 16    Temp: (!) 97.5 F (36.4 C)   SpO2: 98%      Body mass index is 27.81 kg/m.      General: Well-developed, well-nourished, no acute distress. Eyes: Pink conjunctiva, anicteric sclera. HEENT: Normocephalic, moist mucous membranes, clear oropharnyx. Lungs: Clear to auscultation bilaterally. Heart: Regular rate and rhythm. No rubs, murmurs, or gallops. Abdomen: Soft, nontender, nondistended. No organomegaly noted, normoactive bowel sounds. Musculoskeletal: No edema, cyanosis, or clubbing. Neuro: Alert, answering all questions appropriately. Cranial nerves grossly intact. Skin: No rashes or petechiae noted. Psych: Normal affect. Lymphatics: No cervical, calvicular, axillary or  inguinal LAD.   LAB RESULTS:  Lab Results  Component Value Date   NA 140 04/17/2022   K 3.6 04/17/2022   CL 108 04/17/2022   CO2 24 04/17/2022   GLUCOSE 95 04/17/2022   BUN 15 04/17/2022   CREATININE 0.68 04/17/2022   CALCIUM 8.9 04/17/2022   PROT 6.5 04/17/2022   ALBUMIN 2.8 (L) 04/17/2022   AST 13 (L) 04/17/2022   ALT 10 04/17/2022   ALKPHOS 72 04/17/2022   BILITOT 0.9 04/17/2022   GFRNONAA >60 04/17/2022   GFRAA >60 06/27/2017    Lab Results  Component Value Date   WBC 7.2 04/17/2022   NEUTROABS 6.5 04/16/2022   HGB 11.2 (L) 04/17/2022   HCT 36.2 04/17/2022   MCV 81.0 04/17/2022   PLT 289 04/17/2022    No results found for: "TIBC", "FERRITIN", "IRONPCTSAT"   STUDIES: MR BRAIN WO CONTRAST  Result Date: 04/17/2022 CLINICAL DATA:  Initial evaluation for suspected UTI. No other relevant history provided. EXAM: MRI HEAD WITHOUT CONTRAST TECHNIQUE: Multiplanar, multiecho pulse sequences of the brain and surrounding structures were obtained without intravenous contrast. COMPARISON:  CT from earlier the same day. FINDINGS: Brain: Diffuse prominence of the CSF containing spaces compatible with generalized cerebral atrophy, moderately advanced in nature. Patchy and confluent T2/FLAIR hyperintensity  involving the periventricular deep white matter both cerebral hemispheres as well as the pons, consistent with chronic small vessel ischemic disease, moderate in nature. Small remote right cerebellar infarct. Additional remote lacunar infarct at the left thalamic capsular region. No evidence for acute or subacute ischemia. Gray-white matter differentiation maintained. No erosive chronic cortical infarction. No acute or chronic intracranial blood products. No mass lesion, midline shift or mass effect no hydrocephalus or extra-axial fluid collection. Pituitary gland and suprasellar region within normal limits. Vascular: Major intracranial vascular flow voids are maintained. Skull and upper cervical spine: Craniocervical junction within normal limits. Bone marrow signal intensity normal. Scalp soft tissues demonstrate no acute finding. Sinuses/Orbits: Prior bilateral ocular lens replacement. Paranasal sinuses are largely clear. No mastoid effusion. Other: None. IMPRESSION: 1. No acute intracranial abnormality. 2. Moderately advanced cerebral atrophy with chronic small vessel ischemic disease, with small remote infarcts involving the right cerebellum and left thalamocapsular region. Electronically Signed   By: Rise MuBenjamin  McClintock M.D.   On: 04/17/2022 01:02   CT ABDOMEN PELVIS W CONTRAST  Result Date: 04/16/2022 CLINICAL DATA:  Left lower quadrant abdominal pain * Tracking Code: BO * EXAM: CT ABDOMEN AND PELVIS WITH CONTRAST TECHNIQUE: Multidetector CT imaging of the abdomen and pelvis was performed using the standard protocol following bolus administration of intravenous contrast. RADIATION DOSE REDUCTION: This exam was performed according to the departmental dose-optimization program which includes automated exposure control, adjustment of the mA and/or kV according to patient size and/or use of iterative reconstruction technique. CONTRAST:  100mL OMNIPAQUE IOHEXOL 300 MG/ML  SOLN COMPARISON:  11/13/2006  FINDINGS: Lower chest: No acute abnormality.  Small hiatal hernia. Hepatobiliary: No solid liver abnormality is seen. No gallstones, gallbladder wall thickening, or biliary dilatation. Pancreas: Unremarkable. No pancreatic ductal dilatation or surrounding inflammatory changes. Spleen: Normal in size without significant abnormality. Adrenals/Urinary Tract: Adrenal glands are unremarkable. Kidneys are normal, without renal calculi, solid lesion, or hydronephrosis. Bladder is unremarkable. Stomach/Bowel: Stomach is within normal limits. Appendix appears normal. No evidence of bowel wall thickening, distention, or inflammatory changes. Descending and sigmoid diverticulosis. Vascular/Lymphatic: Aortic atherosclerosis. Markedly enlarged left retroperitoneal lymph nodes measuring up to 3.6 x 2.8 cm (series, image 36). Reproductive: Calcified uterine  fibroids. Other: No abdominal wall hernia or abnormality. No ascites. Musculoskeletal: No acute or significant osseous findings. IMPRESSION: 1. Markedly enlarged left retroperitoneal lymph nodes measuring up to 3.6 x 2.8 cm, highly suspicious for lymphoma or nodal metastatic disease. No candidate primary mass identified in the abdomen or pelvis. 2. Descending and sigmoid diverticulosis without evidence of acute diverticulitis. 3. Calcified uterine fibroids. Aortic Atherosclerosis (ICD10-I70.0). Electronically Signed   By: Jearld Lesch M.D.   On: 04/16/2022 19:46   CT Head Wo Contrast  Result Date: 04/16/2022 CLINICAL DATA:  Altered mental status, nausea and vomiting. History of dementia. EXAM: CT HEAD WITHOUT CONTRAST TECHNIQUE: Contiguous axial images were obtained from the base of the skull through the vertex without intravenous contrast. RADIATION DOSE REDUCTION: This exam was performed according to the departmental dose-optimization program which includes automated exposure control, adjustment of the mA and/or kV according to patient size and/or use of iterative  reconstruction technique. COMPARISON:  Head CT 03/01/2012. FINDINGS: Brain: No evidence of acute infarction, hemorrhage, hydrocephalus, extra-axial collection or mass lesion/mass effect. There is mild diffuse atrophy and moderate periventricular and deep white matter hypodensity which likely represents chronic small vessel ischemic change. Findings have progressed compared to the prior study. There is an old lacunar infarct in the left basal ganglia which is new from prior exam. There is a small old infarct in the right cerebellum which is new from prior exam. Vascular: Atherosclerotic calcifications are present within the cavernous internal carotid arteries. Skull: Normal. Negative for fracture or focal lesion. Sinuses/Orbits: No acute finding. Other: None. IMPRESSION: 1. No acute intracranial abnormality. 2. Mild diffuse atrophy and moderate chronic small vessel ischemic change, progressed compared to prior exam. 3. Old lacunar infarcts in the left basal ganglia and right cerebellum, new from prior exam. Electronically Signed   By: Darliss Cheney M.D.   On: 04/16/2022 19:37      Michaelyn Barter, MD   04/17/2022 2:30 PM

## 2022-04-18 DIAGNOSIS — R590 Localized enlarged lymph nodes: Secondary | ICD-10-CM | POA: Diagnosis not present

## 2022-04-18 DIAGNOSIS — R410 Disorientation, unspecified: Secondary | ICD-10-CM | POA: Diagnosis not present

## 2022-04-18 LAB — CBC
HCT: 32.1 % — ABNORMAL LOW (ref 36.0–46.0)
Hemoglobin: 9.9 g/dL — ABNORMAL LOW (ref 12.0–15.0)
MCH: 25.3 pg — ABNORMAL LOW (ref 26.0–34.0)
MCHC: 30.8 g/dL (ref 30.0–36.0)
MCV: 82.1 fL (ref 80.0–100.0)
Platelets: 235 10*3/uL (ref 150–400)
RBC: 3.91 MIL/uL (ref 3.87–5.11)
RDW: 15.9 % — ABNORMAL HIGH (ref 11.5–15.5)
WBC: 5.6 10*3/uL (ref 4.0–10.5)
nRBC: 0 % (ref 0.0–0.2)

## 2022-04-18 LAB — MAGNESIUM: Magnesium: 1.8 mg/dL (ref 1.7–2.4)

## 2022-04-18 LAB — BASIC METABOLIC PANEL
Anion gap: 9 (ref 5–15)
BUN: 10 mg/dL (ref 8–23)
CO2: 24 mmol/L (ref 22–32)
Calcium: 8.6 mg/dL — ABNORMAL LOW (ref 8.9–10.3)
Chloride: 102 mmol/L (ref 98–111)
Creatinine, Ser: 0.64 mg/dL (ref 0.44–1.00)
GFR, Estimated: >60 mL/min (ref >60)
Glucose, Bld: 87 mg/dL (ref 70–99)
Potassium: 3.5 mmol/L (ref 3.5–5.1)
Sodium: 135 mmol/L (ref 135–145)

## 2022-04-18 LAB — PHOSPHORUS: Phosphorus: 2.9 mg/dL (ref 2.5–4.6)

## 2022-04-18 NOTE — Consult Note (Signed)
Chief Complaint: Patient was seen in consultation today for cervical lymphadenopathy  Referring Physician(s): Gillis Santa, MD  Supervising Physician: Ruel Favors  Patient Status: [facility] - In-pt  History of Present Illness: Belinda Gonzalez is a 88 y.o. female with PMH significant for COPD, depression, dementia, and hypertension being seen today in relation to lymphadenopathy. Patient underwent CT Abdomen/Pelvis on 04/16/22 which revealed retroperitoneal lymphadenopathy concerning for lymphoma or metastatic disease. Follow-up CT Chest on 4/11 revealed left cervical lymphadenopathy consistent with the above concerns. IR consulted for possible image-guided biopsy.   Past Medical History:  Diagnosis Date   COPD (chronic obstructive pulmonary disease)    Depression    Hypertension    Thyroid disease     Past Surgical History:  Procedure Laterality Date   CATARACT EXTRACTION      Allergies: Lipitor [atorvastatin]  Medications: Prior to Admission medications   Medication Sig Start Date End Date Taking? Authorizing Provider  acetaminophen (TYLENOL) 500 MG tablet Take 1,000 mg by mouth 2 (two) times daily.   Yes [provider]  alendronate (FOSAMAX) 70 MG tablet Take 70 mg by mouth every 7 (seven) days. 03/23/17  Yes [provider]  aspirin EC 81 MG tablet Take 81 mg by mouth daily.   Yes [provider]  Cholecalciferol (VITAMIN D3) 50000 units TABS Take 50,000 Units by mouth every 7 (seven) days.   Yes [provider]  DULoxetine (CYMBALTA) 30 MG capsule Take 30 mg by mouth daily.   Yes [provider]  levothyroxine (SYNTHROID, LEVOTHROID) 75 MCG tablet Take 75 mcg by mouth daily before breakfast.    Yes [provider]  lisinopril (PRINIVIL,ZESTRIL) 10 MG tablet Take 10 mg by mouth 2 (two) times daily.    Yes [provider]  memantine (NAMENDA) 10 MG tablet Take 10 mg by mouth 2 (two) times daily.    Yes  [provider]  Omega-3 Fatty Acids (FISH OIL) 1000 MG CAPS Take 1,000 mg by mouth 3 (three) times daily.   Yes [provider]  oxybutynin (DITROPAN) 5 MG tablet Take 5 mg by mouth 2 (two) times daily. 05/06/17  Yes [provider]  rivastigmine (EXELON) 4.6 mg/24hr Place 4.6 mg onto the skin daily. 02/28/22 04/29/22 Yes [provider]  rosuvastatin (CRESTOR) 10 MG tablet Take 10 mg by mouth at bedtime.    Yes [provider]  vitamin B-12 (CYANOCOBALAMIN) 1000 MCG tablet Take 1,000 mcg by mouth daily.   Yes [provider]     History reviewed. No pertinent family history.  Social History   Socioeconomic History   Marital status: Married    Spouse name: Not on file   Number of children: Not on file   Years of education: Not on file   Highest education level: Not on file  Occupational History   Not on file  Tobacco Use   Smoking status: Former    Types: Cigarettes    Quit date: 01/07/1999    Years since quitting: 23.2   Smokeless tobacco: Never  Substance and Sexual Activity   Alcohol use: Not Currently   Drug use: Not Currently   Sexual activity: Not on file  Other Topics Concern   Not on file  Social History Narrative   Not on file   Social Determinants of Health   Financial Resource Strain: Not on file  Food Insecurity: No Food Insecurity (04/17/2022)   Hunger Vital Sign    Worried About Running Out of  Food in the Last Year: Never true    Ran Out of Food in the Last Year: Never true  Transportation Needs: No Transportation Needs (04/17/2022)   PRAPARE - Administrator, Civil Service (Medical): No    Lack of Transportation (Non-Medical): No  Physical Activity: Not on file  Stress: Not on file  Social Connections: Not on file    Code Status: Patient currently has DNR order in place. Discussion with the patient and family regarding wishes.  The original DNR order is maintained and prior treatment  limitations are upheld.   Review of Systems:  Review of Systems  Reason unable to perform ROS: Patient with dementia. Limited ROS provided by family at bedside.  Constitutional:  Negative for chills and fever.  Respiratory:  Negative for cough.   Cardiovascular:  Negative for leg swelling.  Gastrointestinal:  Positive for diarrhea and vomiting.  Psychiatric/Behavioral:  Positive for confusion.     Vital Signs: BP (!) 114/56 (BP Location: Left Arm)   Pulse (!) 53   Temp 98.4 F (36.9 C) (Oral)   Resp 16   Ht 5\' 5"  (1.651 m)   Wt 171 lb 1.2 oz (77.6 kg)   SpO2 90%   BMI 28.47 kg/m    Physical Exam Vitals reviewed.  Constitutional:      General: She is not in acute distress.    Appearance: She is ill-appearing.  HENT:     Mouth/Throat:     Mouth: Mucous membranes are moist.  Eyes:     Pupils: Pupils are equal, round, and reactive to light.  Cardiovascular:     Rate and Rhythm: Regular rhythm. Bradycardia present.     Pulses: Normal pulses.     Heart sounds: Normal heart sounds.  Pulmonary:     Effort: Pulmonary effort is normal.     Breath sounds: Normal breath sounds.  Abdominal:     Palpations: Abdomen is soft.     Tenderness: There is no abdominal tenderness.  Musculoskeletal:     Right lower leg: No edema.     Left lower leg: No edema.  Skin:    General: Skin is warm and dry.  Neurological:     Mental Status: She is alert. Mental status is at baseline.  Psychiatric:        Mood and Affect: Mood normal.        Behavior: Behavior normal.     Imaging: CT CHEST W CONTRAST  Result Date: 04/17/2022 CLINICAL DATA:  Chest staging, retroperitoneal lymphadenopathy * Tracking Code: BO * EXAM: CT CHEST WITH CONTRAST TECHNIQUE: Multidetector CT imaging of the chest was performed during intravenous contrast administration. RADIATION DOSE REDUCTION: This exam was performed according to the departmental dose-optimization program which includes automated exposure  control, adjustment of the mA and/or kV according to patient size and/or use of iterative reconstruction technique. CONTRAST:  61mL OMNIPAQUE IOHEXOL 350 MG/ML SOLN COMPARISON:  CT abdomen pelvis, 04/16/2022 FINDINGS: Cardiovascular: Aortic atherosclerosis. Cardiomegaly. Left coronary artery calcifications. No pericardial effusion. Mediastinum/Nodes: Enlarged lower left cervical lymph nodes up to 3.3 x 1.6 cm (series 2, image 11) small hiatal hernia. Thyroid gland, trachea, and esophagus demonstrate no significant findings. Lungs/Pleura: Dependent bibasilar scarring and partial atelectasis. No pleural effusion or pneumothorax. Upper Abdomen: No acute abnormality. Upper abdominal lymphadenopathy as reported by prior CT. Musculoskeletal: No chest wall abnormality. No acute osseous findings. IMPRESSION: 1. Enlarged lower left cervical lymph nodes up to 3.3 x 1.6 cm. Upper abdominal lymphadenopathy as reported  by prior CT. Findings remain consistent with lymphoma or nodal metastatic disease. 2. No candidate primary lesion identified in the chest. 3. Cardiomegaly and coronary artery disease. Aortic Atherosclerosis (ICD10-I70.0). Electronically Signed   By: Jearld Lesch M.D.   On: 04/17/2022 17:59   MR BRAIN WO CONTRAST  Result Date: 04/17/2022 CLINICAL DATA:  Initial evaluation for suspected UTI. No other relevant history provided. EXAM: MRI HEAD WITHOUT CONTRAST TECHNIQUE: Multiplanar, multiecho pulse sequences of the brain and surrounding structures were obtained without intravenous contrast. COMPARISON:  CT from earlier the same day. FINDINGS: Brain: Diffuse prominence of the CSF containing spaces compatible with generalized cerebral atrophy, moderately advanced in nature. Patchy and confluent T2/FLAIR hyperintensity involving the periventricular deep white matter both cerebral hemispheres as well as the pons, consistent with chronic small vessel ischemic disease, moderate in nature. Small remote right cerebellar  infarct. Additional remote lacunar infarct at the left thalamic capsular region. No evidence for acute or subacute ischemia. Gray-white matter differentiation maintained. No erosive chronic cortical infarction. No acute or chronic intracranial blood products. No mass lesion, midline shift or mass effect no hydrocephalus or extra-axial fluid collection. Pituitary gland and suprasellar region within normal limits. Vascular: Major intracranial vascular flow voids are maintained. Skull and upper cervical spine: Craniocervical junction within normal limits. Bone marrow signal intensity normal. Scalp soft tissues demonstrate no acute finding. Sinuses/Orbits: Prior bilateral ocular lens replacement. Paranasal sinuses are largely clear. No mastoid effusion. Other: None. IMPRESSION: 1. No acute intracranial abnormality. 2. Moderately advanced cerebral atrophy with chronic small vessel ischemic disease, with small remote infarcts involving the right cerebellum and left thalamocapsular region. Electronically Signed   By: Rise Mu M.D.   On: 04/17/2022 01:02   CT ABDOMEN PELVIS W CONTRAST  Result Date: 04/16/2022 CLINICAL DATA:  Left lower quadrant abdominal pain * Tracking Code: BO * EXAM: CT ABDOMEN AND PELVIS WITH CONTRAST TECHNIQUE: Multidetector CT imaging of the abdomen and pelvis was performed using the standard protocol following bolus administration of intravenous contrast. RADIATION DOSE REDUCTION: This exam was performed according to the departmental dose-optimization program which includes automated exposure control, adjustment of the mA and/or kV according to patient size and/or use of iterative reconstruction technique. CONTRAST:  OMNIPAQUE IOHEXOL 300 MG/ML  SOLN COMPARISON:  11/13/2006 FINDINGS: Lower chest: No acute abnormality.  Small hiatal hernia. Hepatobiliary: No solid liver abnormality is seen. No gallstones, gallbladder wall thickening, or biliary dilatation. Pancreas: Unremarkable.  No pancreatic ductal dilatation or surrounding inflammatory changes. Spleen: Normal in size without significant abnormality. Adrenals/Urinary Tract: Adrenal glands are unremarkable. Kidneys are normal, without renal calculi, solid lesion, or hydronephrosis. Bladder is unremarkable. Stomach/Bowel: Stomach is within normal limits. Appendix appears normal. No evidence of bowel wall thickening, distention, or inflammatory changes. Descending and sigmoid diverticulosis. Vascular/Lymphatic: Aortic atherosclerosis. Markedly enlarged left retroperitoneal lymph nodes measuring up to 3.6 x 2.8 cm (series, image 36). Reproductive: Calcified uterine fibroids. Other: No abdominal wall hernia or abnormality. No ascites. Musculoskeletal: No acute or significant osseous findings. IMPRESSION: 1. Markedly enlarged left retroperitoneal lymph nodes measuring up to 3.6 x 2.8 cm, highly suspicious for lymphoma or nodal metastatic disease. No candidate primary mass identified in the abdomen or pelvis. 2. Descending and sigmoid diverticulosis without evidence of acute diverticulitis. 3. Calcified uterine fibroids. Aortic Atherosclerosis (ICD10-I70.0). Electronically Signed   By: Jearld Lesch M.D.   On: 04/16/2022 19:46   CT Head Wo Contrast  Result Date: 04/16/2022 CLINICAL DATA:  Altered mental status, nausea and vomiting. History of  dementia. EXAM: CT HEAD WITHOUT CONTRAST TECHNIQUE: Contiguous axial images were obtained from the base of the skull through the vertex without intravenous contrast. RADIATION DOSE REDUCTION: This exam was performed according to the departmental dose-optimization program which includes automated exposure control, adjustment of the mA and/or kV according to patient size and/or use of iterative reconstruction technique. COMPARISON:  Head CT 03/01/2012. FINDINGS: Brain: No evidence of acute infarction, hemorrhage, hydrocephalus, extra-axial collection or mass lesion/mass effect. There is mild diffuse  atrophy and moderate periventricular and deep white matter hypodensity which likely represents chronic small vessel ischemic change. Findings have progressed compared to the prior study. There is an old lacunar infarct in the left basal ganglia which is new from prior exam. There is a small old infarct in the right cerebellum which is new from prior exam. Vascular: Atherosclerotic calcifications are present within the cavernous internal carotid arteries. Skull: Normal. Negative for fracture or focal lesion. Sinuses/Orbits: No acute finding. Other: None. IMPRESSION: 1. No acute intracranial abnormality. 2. Mild diffuse atrophy and moderate chronic small vessel ischemic change, progressed compared to prior exam. 3. Old lacunar infarcts in the left basal ganglia and right cerebellum, new from prior exam. Electronically Signed   By: Darliss Cheney M.D.   On: 04/16/2022 19:37    Labs:  CBC: Recent Labs    10/08/21 1522 04/16/22 1809 04/17/22 0409 04/18/22 0308  WBC 6.6 9.0 7.2 5.6  HGB 13.0 12.0 11.2* 9.9*  HCT 41.3 39.5 36.2 32.1*  PLT 213 363 289 235    COAGS: Recent Labs    10/08/21 1522  INR 1.1    BMP: Recent Labs    10/08/21 1522 04/16/22 1809 04/17/22 0409 04/18/22 0308  NA 140 138 140 135  K 4.0 3.8 3.6 3.5  CL 111 105 108 102  CO2 22 23 24 24   GLUCOSE 120* 117* 95 87  BUN 17 21 15 10   CALCIUM 9.1 9.6 8.9 8.6*  CREATININE 0.75 0.89 0.68 0.64  GFRNONAA >60 >60 >60 >60    LIVER FUNCTION TESTS: Recent Labs    04/16/22 1809 04/17/22 0409  BILITOT 1.1 0.9  AST 14* 13*  ALT 10 10  ALKPHOS 79 72  PROT 7.6 6.5  ALBUMIN 3.3* 2.8*    TUMOR MARKERS: No results for input(s): "AFPTM", "CEA", "CA199", "CHROMGRNA" in the last 8760 hours.  Assessment and Plan:  Belinda Gonzalez is an 88 yo female being seen today in relation to cervical lymphadenopathy concerning for lymphoma or metastatic nodal disease. IR consulted for image-guided biopsy of left cervical nodes. Case  reviewed and approved by Dr Miles Costain for Monday 04/21/22. Patient to be made NPO at midnight on 04/21/22.   Risks and benefits of image-guided left cervical lymph node biopsy was discussed with the patient and patient's family including, but not limited to bleeding, infection, damage to adjacent structures or low yield requiring additional tests.  All of the questions were answered and there is agreement to proceed.  Consent signed and in chart.   Thank you for this interesting consult.  I greatly enjoyed meeting Belinda Gonzalez and look forward to participating in their care.  A copy of this report was sent to the requesting provider on this date.  Electronically Signed: Kennieth Francois, PA-C 04/18/2022, 4:22 PM   I spent a total of 20 Minutes  in face to face in clinical consultation, greater than 50% of which was counseling/coordinating care for cervical lymphadenopathy.

## 2022-04-18 NOTE — Progress Notes (Signed)
PT Cancellation Note  Patient Details Name: Belinda Gonzalez MRN: [id number] DOB: Jun 23, 1934   Cancelled Treatment:    Reason Eval/Treat Not Completed:  (Patient in the bed and family/caregiver at the bedside. Caregiver is requesting to hold on therapy until after biopsy has been completed. PT to continue with attempts)  [name] Bernard, PT, MPT  [name] 04/18/2022, 2:11 PM

## 2022-04-18 NOTE — Progress Notes (Addendum)
[facility]  [phone] [phone]  ID: Belinda Gonzalez OB: 30-May-1934  MR#: [id number]  CSN#:[id number]  Patient Care Team: Marisue Ivan, MD as PCP - General (Family Medicine)  ASSESSMENT AND PLAN:   Belinda Gonzalez is a 88 y.o. female with pmh of COPD, depression, hypertension tension and advanced dementia presented to [facility] ED on 04/16/2022 with concerns of diarrhea, nausea, generalized weakness for the past few days.  Had incidental finding of retroperitoneal lymphadenopathy.   # Retroperitoneal lymphadenopathy # Cervical lymphadenopathy -CT abdomen pelvis done for diarrhea showed incidental finding of markedly enlarged left retroperitoneal lymph node measuring up to 3.6 x 2.8 cm.  Highly suspicious for lymphoma or nodal metastatic disease.  No primary mass identified in the abdomen or pelvis.  -CT chest showed 3.3 cm left lower neck lymph node.  -Patient was seen today at bedside accompanied by husband, daughter and daughter-in-law/caregiver. They all would like to proceed with inpatient lymph node biopsy.  Discussed with Dr. Lucianne Muss and he made referral for IR to evaluate.     -I will arrange for outpatient follow-up at the cancer center in 2 to 3 weeks to discuss the biopsy results and next steps in the treatment.   # Advanced dementia # Chronic stroke -on aspirin and statin # A-fib # Hypertension # Hypothyroidism   Rest as per primary team.   Patient expressed understanding and was in agreement with this plan. She also understands that She can call clinic at any time with any questions, concerns, or complaints.    I spent a total of 35 minutes reviewing chart data, face-to-face evaluation with the patient, counseling and coordination of care as detailed above.   HPI: Belinda Gonzalez is a 88 y.o. female with past medical history of COPD, depression, hypertension tension and advanced dementia presented to [facility] ED on 04/16/2022 with  concerns of diarrhea, nausea, generalized weakness for the past few days.  Her diarrhea had resolved but overall she was feeling very weak and possibly dehydrated.  UA positive for UTI.  Urine culture is pending.  She is on IV fluids and ceftriaxone.  She is also more confused than baseline.  CT head and MRI brain showed 2 remote strokes.  CT abdomen pelvis showed incidental finding of markedly enlarged left retroperitoneal lymph node measuring up to 3.6 x 2.8 cm.  Highly suspicious for lymphoma or nodal metastatic disease.  No primary mass identified in the abdomen or pelvis.   All information was obtained from patient's ex daughter-in-law and caregiver.  Patient has advanced dementia and is not able to participate in conversation.  She is not oriented to self, place or person.  She needs assistance with all her ADLs.  Husband is POA.   04/18/2022-patient was seen today was comfortably resting.  Unable to obtain review of system due to advanced dementia.   REVIEW OF SYSTEMS:   ROS   PAST MEDICAL HISTORY: Past Medical History:  Diagnosis Date   COPD (chronic obstructive pulmonary disease)    Depression    Hypertension    Thyroid disease     PAST SURGICAL HISTORY: Past Surgical History:  Procedure Laterality Date   CATARACT EXTRACTION      FAMILY HISTORY: History reviewed. No pertinent family history.  HEALTH MAINTENANCE: Social History   Tobacco Use   Smoking status: Former    Types: Cigarettes    Quit date: 01/07/1999    Years since quitting: 23.2   Smokeless tobacco: Never  Substance  Use Topics   Alcohol use: Not Currently   Drug use: Not Currently     Allergies  Allergen Reactions   Lipitor [Atorvastatin]     Current Facility-Administered Medications  Medication Dose Route Frequency Provider Last Rate Last Admin   0.9 %  sodium chloride infusion   Intravenous Continuous Gertha Calkin, MD 50 mL/hr at 04/17/22 0036 New Bag at 04/17/22 0036   acetaminophen (TYLENOL)  tablet 650 mg  650 mg Oral Q6H PRN Gertha Calkin, MD       Or   acetaminophen (TYLENOL) suppository 650 mg  650 mg Rectal Q6H PRN Gertha Calkin, MD       aspirin EC tablet 81 mg  81 mg Oral Daily Irena Cords V, MD   81 mg at 04/18/22 0910   cefTRIAXone (ROCEPHIN) 1 g in sodium chloride 0.9 % 100 mL IVPB  1 g Intravenous Q24H Irena Cords V, MD 200 mL/hr at 04/17/22 2029 1 g at 04/17/22 2029   DULoxetine (CYMBALTA) DR capsule 30 mg  30 mg Oral Daily Irena Cords V, MD   30 mg at 04/18/22 0909   heparin injection 5,000 Units  5,000 Units Subcutaneous Q8H Gertha Calkin, MD   5,000 Units at 04/18/22 0522   levothyroxine (SYNTHROID) tablet 75 mcg  75 mcg Oral Q0600 Gertha Calkin, MD   75 mcg at 04/18/22 0522   lisinopril (ZESTRIL) tablet 10 mg  10 mg Oral BID Gillis Santa, MD   10 mg at 04/18/22 0910   memantine (NAMENDA) tablet 10 mg  10 mg Oral BID Gertha Calkin, MD   10 mg at 04/18/22 0910   oxybutynin (DITROPAN) tablet 5 mg  5 mg Oral BID Irena Cords V, MD   5 mg at 04/18/22 0910   pantoprazole (PROTONIX) injection 40 mg  40 mg Intravenous Q12H Irena Cords V, MD   40 mg at 04/18/22 0910   rivastigmine (EXELON) 4.6 mg/24hr 4.6 mg  4.6 mg Transdermal Daily Irena Cords V, MD   4.6 mg at 04/18/22 0915   rosuvastatin (CRESTOR) tablet 10 mg  10 mg Oral QHS Irena Cords V, MD   10 mg at 04/17/22 2204   sodium chloride flush (NS) 0.9 % injection 3 mL  3 mL Intravenous Q12H Irena Cords V, MD   3 mL at 04/18/22 0915    OBJECTIVE: Vitals:   04/18/22 0845 04/18/22 0900  BP: (!) 150/54   Pulse: (!) 53 (!) 58  Resp: 16   Temp: 98 F (36.7 C)   SpO2: 100%      Body mass index is 28.47 kg/m.      General: Well-developed, well-nourished, no acute distress. Eyes: Pink conjunctiva, anicteric sclera. HEENT: Normocephalic, moist mucous membranes, clear oropharnyx. Lungs: Clear to auscultation bilaterally. Heart: Regular rate and rhythm. No rubs, murmurs, or gallops. Abdomen: Soft, nontender, nondistended.  No organomegaly noted, normoactive bowel sounds. Musculoskeletal: No edema, cyanosis, or clubbing. Neuro: Alert, answering all questions appropriately. Cranial nerves grossly intact. Skin: No rashes or petechiae noted. Psych: Normal affect. Lymphatics: No cervical, calvicular, axillary or inguinal LAD.   LAB RESULTS:  Lab Results  Component Value Date   NA 135 04/18/2022   K 3.5 04/18/2022   CL 102 04/18/2022   CO2 24 04/18/2022   GLUCOSE 87 04/18/2022   BUN 10 04/18/2022   CREATININE 0.64 04/18/2022   CALCIUM 8.6 (L) 04/18/2022   PROT 6.5 04/17/2022   ALBUMIN 2.8 (L) 04/17/2022   AST  13 (L) 04/17/2022   ALT 10 04/17/2022   ALKPHOS 72 04/17/2022   BILITOT 0.9 04/17/2022   GFRNONAA >60 04/18/2022   GFRAA >60 06/27/2017    Lab Results  Component Value Date   WBC 5.6 04/18/2022   NEUTROABS 6.5 04/16/2022   HGB 9.9 (L) 04/18/2022   HCT 32.1 (L) 04/18/2022   MCV 82.1 04/18/2022   PLT 235 04/18/2022    No results found for: "TIBC", "FERRITIN", "IRONPCTSAT"   STUDIES: CT CHEST W CONTRAST  Result Date: 04/17/2022 CLINICAL DATA:  Chest staging, retroperitoneal lymphadenopathy * Tracking Code: BO * EXAM: CT CHEST WITH CONTRAST TECHNIQUE: Multidetector CT imaging of the chest was performed during intravenous contrast administration. RADIATION DOSE REDUCTION: This exam was performed according to the departmental dose-optimization program which includes automated exposure control, adjustment of the mA and/or kV according to patient size and/or use of iterative reconstruction technique. CONTRAST:  75mL OMNIPAQUE IOHEXOL 350 MG/ML SOLN COMPARISON:  CT abdomen pelvis, 04/16/2022 FINDINGS: Cardiovascular: Aortic atherosclerosis. Cardiomegaly. Left coronary artery calcifications. No pericardial effusion. Mediastinum/Nodes: Enlarged lower left cervical lymph nodes up to 3.3 x 1.6 cm (series 2, image 11) small hiatal hernia. Thyroid gland, trachea, and esophagus demonstrate no significant  findings. Lungs/Pleura: Dependent bibasilar scarring and partial atelectasis. No pleural effusion or pneumothorax. Upper Abdomen: No acute abnormality. Upper abdominal lymphadenopathy as reported by prior CT. Musculoskeletal: No chest wall abnormality. No acute osseous findings. IMPRESSION: 1. Enlarged lower left cervical lymph nodes up to 3.3 x 1.6 cm. Upper abdominal lymphadenopathy as reported by prior CT. Findings remain consistent with lymphoma or nodal metastatic disease. 2. No candidate primary lesion identified in the chest. 3. Cardiomegaly and coronary artery disease. Aortic Atherosclerosis (ICD10-I70.0). Electronically Signed   By: Jearld Lesch M.D.   On: 04/17/2022 17:59   MR BRAIN WO CONTRAST  Result Date: 04/17/2022 CLINICAL DATA:  Initial evaluation for suspected UTI. No other relevant history provided. EXAM: MRI HEAD WITHOUT CONTRAST TECHNIQUE: Multiplanar, multiecho pulse sequences of the brain and surrounding structures were obtained without intravenous contrast. COMPARISON:  CT from earlier the same day. FINDINGS: Brain: Diffuse prominence of the CSF containing spaces compatible with generalized cerebral atrophy, moderately advanced in nature. Patchy and confluent T2/FLAIR hyperintensity involving the periventricular deep white matter both cerebral hemispheres as well as the pons, consistent with chronic small vessel ischemic disease, moderate in nature. Small remote right cerebellar infarct. Additional remote lacunar infarct at the left thalamic capsular region. No evidence for acute or subacute ischemia. Gray-white matter differentiation maintained. No erosive chronic cortical infarction. No acute or chronic intracranial blood products. No mass lesion, midline shift or mass effect no hydrocephalus or extra-axial fluid collection. Pituitary gland and suprasellar region within normal limits. Vascular: Major intracranial vascular flow voids are maintained. Skull and upper cervical spine:  Craniocervical junction within normal limits. Bone marrow signal intensity normal. Scalp soft tissues demonstrate no acute finding. Sinuses/Orbits: Prior bilateral ocular lens replacement. Paranasal sinuses are largely clear. No mastoid effusion. Other: None. IMPRESSION: 1. No acute intracranial abnormality. 2. Moderately advanced cerebral atrophy with chronic small vessel ischemic disease, with small remote infarcts involving the right cerebellum and left thalamocapsular region. Electronically Signed   By: Rise Mu M.D.   On: 04/17/2022 01:02   CT ABDOMEN PELVIS W CONTRAST  Result Date: 04/16/2022 CLINICAL DATA:  Left lower quadrant abdominal pain * Tracking Code: BO * EXAM: CT ABDOMEN AND PELVIS WITH CONTRAST TECHNIQUE: Multidetector CT imaging of the abdomen and pelvis was performed using the  standard protocol following bolus administration of intravenous contrast. RADIATION DOSE REDUCTION: This exam was performed according to the departmental dose-optimization program which includes automated exposure control, adjustment of the mA and/or kV according to patient size and/or use of iterative reconstruction technique. CONTRAST:  OMNIPAQUE IOHEXOL 300 MG/ML  SOLN COMPARISON:  11/13/2006 FINDINGS: Lower chest: No acute abnormality.  Small hiatal hernia. Hepatobiliary: No solid liver abnormality is seen. No gallstones, gallbladder wall thickening, or biliary dilatation. Pancreas: Unremarkable. No pancreatic ductal dilatation or surrounding inflammatory changes. Spleen: Normal in size without significant abnormality. Adrenals/Urinary Tract: Adrenal glands are unremarkable. Kidneys are normal, without renal calculi, solid lesion, or hydronephrosis. Bladder is unremarkable. Stomach/Bowel: Stomach is within normal limits. Appendix appears normal. No evidence of bowel wall thickening, distention, or inflammatory changes. Descending and sigmoid diverticulosis. Vascular/Lymphatic: Aortic  atherosclerosis. Markedly enlarged left retroperitoneal lymph nodes measuring up to 3.6 x 2.8 cm (series, image 36). Reproductive: Calcified uterine fibroids. Other: No abdominal wall hernia or abnormality. No ascites. Musculoskeletal: No acute or significant osseous findings. IMPRESSION: 1. Markedly enlarged left retroperitoneal lymph nodes measuring up to 3.6 x 2.8 cm, highly suspicious for lymphoma or nodal metastatic disease. No candidate primary mass identified in the abdomen or pelvis. 2. Descending and sigmoid diverticulosis without evidence of acute diverticulitis. 3. Calcified uterine fibroids. Aortic Atherosclerosis (ICD10-I70.0). Electronically Signed   By: Jearld Lesch M.D.   On: 04/16/2022 19:46   CT Head Wo Contrast  Result Date: 04/16/2022 CLINICAL DATA:  Altered mental status, nausea and vomiting. History of dementia. EXAM: CT HEAD WITHOUT CONTRAST TECHNIQUE: Contiguous axial images were obtained from the base of the skull through the vertex without intravenous contrast. RADIATION DOSE REDUCTION: This exam was performed according to the departmental dose-optimization program which includes automated exposure control, adjustment of the mA and/or kV according to patient size and/or use of iterative reconstruction technique. COMPARISON:  Head CT 03/01/2012. FINDINGS: Brain: No evidence of acute infarction, hemorrhage, hydrocephalus, extra-axial collection or mass lesion/mass effect. There is mild diffuse atrophy and moderate periventricular and deep white matter hypodensity which likely represents chronic small vessel ischemic change. Findings have progressed compared to the prior study. There is an old lacunar infarct in the left basal ganglia which is new from prior exam. There is a small old infarct in the right cerebellum which is new from prior exam. Vascular: Atherosclerotic calcifications are present within the cavernous internal carotid arteries. Skull: Normal. Negative for fracture or  focal lesion. Sinuses/Orbits: No acute finding. Other: None. IMPRESSION: 1. No acute intracranial abnormality. 2. Mild diffuse atrophy and moderate chronic small vessel ischemic change, progressed compared to prior exam. 3. Old lacunar infarcts in the left basal ganglia and right cerebellum, new from prior exam. Electronically Signed   By: Darliss Cheney M.D.   On: 04/16/2022 19:37      Michaelyn Barter, MD   04/18/2022 12:11 PM

## 2022-04-18 NOTE — Progress Notes (Signed)
[facility] Progress Note  Patient: Belinda Gonzalez    LMB:[id number]  DOA: 04/16/2022     Date of Service: the patient was seen and examined on 04/18/2022  Chief Complaint  Patient presents with   Nausea   Brief hospital course: Belinda Gonzalez is an 88 y.o. female with PMH of hypertension, paroxysmal A-fib, CVA, hypothyroid, COPD, Alzheimer's dementia as reviewed from EMR who presented at [facility] ED with complaining of generalized weakness, decreased oral intake and ambulation for past 3 days.  Patient had nausea vomiting and diarrhea for 3 days which resolved and after that patient developed generalized weakness possible dehydration and feeling very weak, unable to do ADLs what she was doing at baseline.  Currently patient is bedbound unable to ambulate.  No behavioral issues. ED workup CT head and MRI brain shows chronic CVA, no acute findings. UA positive for UTI, CBC and CMP within normal range Patient was admitted under [facility] service for further management as below.  Assessment and Plan:  UTI, UA positive Continue ceftriaxone 1 g IV daily Follow urine culture  Generalized weakness secondary to dehydration and recent acute gastroenteritis Continue gentle hydration with IV fluid and continue oral hydration CT head and MRI brain showed chronic CVA.  Patient does not have any focal deficits.  No acute findings on imaging Continue supportive care, aspiration precaution and fall precautions Ambulate with assistance. Follow PT/OT and TOC for placement  Retroperitoneal lymphadenopathy, and cervical lymphadenopathy possible lymphoma vs mets Ct Chest shows cervical biopsy, no involvement of lungs. Family agreed for biopsy IR consulted for ultrasound-guided biopsy of cervical lymph node Patient will follow-up with oncology as an outpatient in 2 to 3 weeks to discuss the biopsy report.  HTN, proximal A-fib Resumed lisinopril 10 mg p.o. twice daily home dose Rate is controlled,  intermittent bradycardia noticed Continue to monitor BP and titrate medications accordingly  Chronic CVA, incidental finding on CT and MRI brain Continue aspirin and statin  Depression and Alzheimer's dementia, continued Namenda and Exelon patch Continued Cymbalta Hypothyroid, continued Synthroid  Body mass index is 27.81 kg/m.  Interventions:     Diet: Regular diet DVT Prophylaxis: Subcutaneous Heparin    Advance goals of care discussion: DNR  Family Communication: family was present at bedside, at the time of interview.  The pt provided permission to discuss medical plan with the family. Opportunity was given to ask question and all questions were answered satisfactorily.   Disposition:  Pt is from Home, admitted with generalized weakness and UTI, incidental finding of lymphadenopathy, awaiting for ultrasound-guided biopsy by IR, PT/OT eval, which precludes a safe discharge. Discharge to most likely SNF, TBD after PT/OT eval. discharge most likely in 1 to 2 days  Subjective: No significant events overnight, patient has significant dementia AAO x 1 and very hard of hearing.  Seems to be resting comfortably, unable to offer any complaints.  Patient's daughter was at bedside, management plan discussed and she agreed for lymph node biopsy.  Physical Exam: General: NAD, lying comfortably Appear in no distress, affect appropriate Eyes: PERRLA ENT: Oral Mucosa Clear, moist  Neck: no JVD,  Cardiovascular: S1 and S2 Present, no Murmur,  Respiratory: good respiratory effort, Bilateral Air entry equal and Decreased, no Crackles, no wheezes Abdomen: Bowel Sound present, Soft and no tenderness,  Skin: no rashes Extremities: no Pedal edema, no calf tenderness Neurologic: without any new focal findings Gait not checked due to patient safety concerns  Vitals:   04/18/22 0500 04/18/22 0559 04/18/22  0845 04/18/22 0900  BP:  119/60 (!) 150/54   Pulse:  (!) 49 (!) 53 (!) 58  Resp:  20 16    Temp:  98.5 F (36.9 C) 98 F (36.7 C)   TempSrc:   Oral   SpO2:  98% 100%   Weight: 77.6 kg     Height:        Intake/Output Summary (Last 24 hours) at 04/18/2022 1358 Last data filed at 04/18/2022 1055 Gross per 24 hour  Intake 1647.85 ml  Output --  Net 1647.85 ml   Filed Weights   04/17/22 0012 04/17/22 0500 04/18/22 0500  Weight: 75.6 kg 75.8 kg 77.6 kg    Data Reviewed: I have personally reviewed and interpreted daily labs, tele strips, imagings as discussed above. I reviewed all nursing notes, pharmacy notes, vitals, pertinent old records I have discussed plan of care as described above with RN and patient/family.  CBC: Recent Labs  Lab 04/16/22 1809 04/17/22 0409 04/18/22 0308  WBC 9.0 7.2 5.6  NEUTROABS 6.5  --   --   HGB 12.0 11.2* 9.9*  HCT 39.5 36.2 32.1*  MCV 82.8 81.0 82.1  PLT 363 289 235   Basic Metabolic Panel: Recent Labs  Lab 04/16/22 1809 04/17/22 0409 04/18/22 0308  NA 138 140 135  K 3.8 3.6 3.5  CL 105 108 102  CO2 GLUCOSE 117* 95 87  BUN CREATININE 0.89 0.68 0.64  CALCIUM 9.6 8.9 8.6*  MG  --  2.0 1.8  PHOS  --  2.6 2.9    Studies: CT CHEST W CONTRAST  Result Date: 04/17/2022 CLINICAL DATA:  Chest staging, retroperitoneal lymphadenopathy * Tracking Code: BO * EXAM: CT CHEST WITH CONTRAST TECHNIQUE: Multidetector CT imaging of the chest was performed during intravenous contrast administration. RADIATION DOSE REDUCTION: This exam was performed according to the departmental dose-optimization program which includes automated exposure control, adjustment of the mA and/or kV according to patient size and/or use of iterative reconstruction technique. CONTRAST:  75mL OMNIPAQUE IOHEXOL 350 MG/ML SOLN COMPARISON:  CT abdomen pelvis, 04/16/2022 FINDINGS: Cardiovascular: Aortic atherosclerosis. Cardiomegaly. Left coronary artery calcifications. No pericardial effusion. Mediastinum/Nodes: Enlarged lower left cervical lymph  nodes up to 3.3 x 1.6 cm (series 2, image 11) small hiatal hernia. Thyroid gland, trachea, and esophagus demonstrate no significant findings. Lungs/Pleura: Dependent bibasilar scarring and partial atelectasis. No pleural effusion or pneumothorax. Upper Abdomen: No acute abnormality. Upper abdominal lymphadenopathy as reported by prior CT. Musculoskeletal: No chest wall abnormality. No acute osseous findings. IMPRESSION: 1. Enlarged lower left cervical lymph nodes up to 3.3 x 1.6 cm. Upper abdominal lymphadenopathy as reported by prior CT. Findings remain consistent with lymphoma or nodal metastatic disease. 2. No candidate primary lesion identified in the chest. 3. Cardiomegaly and coronary artery disease. Aortic Atherosclerosis (ICD10-I70.0). Electronically Signed   By: Jearld Lesch M.D.   On: 04/17/2022 17:59    Scheduled Meds:  aspirin EC  81 mg Oral Daily   DULoxetine  30 mg Oral Daily   heparin  5,000 Units Subcutaneous Q8H   levothyroxine  75 mcg Oral Q0600   lisinopril  10 mg Oral BID   memantine  10 mg Oral BID   oxybutynin  5 mg Oral BID   pantoprazole (PROTONIX) IV  40 mg Intravenous Q12H   rivastigmine  4.6 mg Transdermal Daily   rosuvastatin  10 mg Oral QHS   sodium chloride flush  3 mL Intravenous Q12H  Continuous Infusions:  sodium chloride 50 mL/hr at 04/17/22 0036   cefTRIAXone (ROCEPHIN)  IV 1 g (04/17/22 2029)   PRN Meds: acetaminophen **OR** acetaminophen  Time spent: 35 minutes  Author: Gillis Santa. MD [facility] 04/18/2022 1:58 PM  To reach On-call, see care teams to locate the attending and reach out to them via [email or web address]. If 7PM-7AM, please contact night-coverage If you still have difficulty reaching the attending provider, please page the [facility] (Director on Call) for [facility] on amion for assistance.

## 2022-04-19 DIAGNOSIS — N390 Urinary tract infection, site not specified: Secondary | ICD-10-CM

## 2022-04-19 DIAGNOSIS — Z515 Encounter for palliative care: Secondary | ICD-10-CM

## 2022-04-19 DIAGNOSIS — Z7189 Other specified counseling: Secondary | ICD-10-CM | POA: Diagnosis not present

## 2022-04-19 DIAGNOSIS — G4700 Insomnia, unspecified: Secondary | ICD-10-CM

## 2022-04-19 DIAGNOSIS — R410 Disorientation, unspecified: Secondary | ICD-10-CM | POA: Diagnosis not present

## 2022-04-19 DIAGNOSIS — R590 Localized enlarged lymph nodes: Secondary | ICD-10-CM | POA: Diagnosis not present

## 2022-04-19 LAB — BASIC METABOLIC PANEL
Anion gap: 8 (ref 5–15)
BUN: 6 mg/dL — ABNORMAL LOW (ref 8–23)
CO2: 24 mmol/L (ref 22–32)
Calcium: 8.4 mg/dL — ABNORMAL LOW (ref 8.9–10.3)
Chloride: 105 mmol/L (ref 98–111)
Creatinine, Ser: 0.7 mg/dL (ref 0.44–1.00)
GFR, Estimated: >60 mL/min (ref >60)
Glucose, Bld: 116 mg/dL — ABNORMAL HIGH (ref 70–99)
Potassium: 3.1 mmol/L — ABNORMAL LOW (ref 3.5–5.1)
Sodium: 137 mmol/L (ref 135–145)

## 2022-04-19 LAB — CBC
HCT: 31.4 % — ABNORMAL LOW (ref 36.0–46.0)
Hemoglobin: 9.8 g/dL — ABNORMAL LOW (ref 12.0–15.0)
MCH: 25.4 pg — ABNORMAL LOW (ref 26.0–34.0)
MCHC: 31.2 g/dL (ref 30.0–36.0)
MCV: 81.3 fL (ref 80.0–100.0)
Platelets: 251 10*3/uL (ref 150–400)
RBC: 3.86 MIL/uL — ABNORMAL LOW (ref 3.87–5.11)
RDW: 15.9 % — ABNORMAL HIGH (ref 11.5–15.5)
WBC: 6.3 10*3/uL (ref 4.0–10.5)
nRBC: 0 % (ref 0.0–0.2)

## 2022-04-19 LAB — URINE CULTURE

## 2022-04-19 LAB — PHOSPHORUS: Phosphorus: 2.8 mg/dL (ref 2.5–4.6)

## 2022-04-19 LAB — MAGNESIUM: Magnesium: 1.9 mg/dL (ref 1.7–2.4)

## 2022-04-19 MED ORDER — POTASSIUM CHLORIDE CRYS ER 20 MEQ PO TBCR
40.0000 meq | EXTENDED_RELEASE_TABLET | ORAL | Status: AC
  Administered 2022-04-19 (×2): 40 meq via ORAL
  Filled 2022-04-19 (×2): qty 2

## 2022-04-19 MED ORDER — SENNOSIDES-DOCUSATE SODIUM 8.6-50 MG PO TABS
2.0000 | ORAL_TABLET | Freq: Once | ORAL | Status: AC
  Administered 2022-04-19: 2 via ORAL
  Filled 2022-04-19: qty 2

## 2022-04-19 MED ORDER — BISACODYL 10 MG RE SUPP
10.0000 mg | Freq: Every day | RECTAL | Status: DC | PRN
  Administered 2022-04-20: 10 mg via RECTAL
  Filled 2022-04-19: qty 1

## 2022-04-19 MED ORDER — POTASSIUM CHLORIDE 10 MEQ/100ML IV SOLN: 10.0000 meq | INTRAVENOUS | Status: DC

## 2022-04-19 MED ORDER — QUETIAPINE FUMARATE 25 MG PO TABS
50.0000 mg | ORAL_TABLET | Freq: Every evening | ORAL | Status: DC
  Filled 2022-04-19: qty 2

## 2022-04-19 MED ORDER — MELATONIN 5 MG PO TABS
5.0000 mg | ORAL_TABLET | Freq: Every day | ORAL | Status: DC
  Administered 2022-04-19 – 2022-04-23 (×5): 5 mg via ORAL
  Filled 2022-04-19 (×5): qty 1

## 2022-04-19 NOTE — Consult Note (Signed)
Consultation Note Date: 04/19/2022   Patient Name: Belinda Gonzalez  DOB: [birth year]  MRN: [id number]  Age / Sex: 88 y.o., female  PCP: Belinda Ivan, MD Referring Physician: Gillis Santa, MD  Reason for Consultation: Establishing goals of care   HPI/Brief Hospital Course: 88 y.o. female  with past medical history of Alzheimer's dementia, COPD, HTN, paroxsymal A. Fib. CVA and hypothyroidism admitted from home on 04/16/2022 with generalized weakness increased from baseline, decreased oral intake, NVD x 3 days.  Work-up revealed CT head/MRI chronic CVA-no acute findings UTI + Retroperitoneal lymphadenopathy concern for possible lymphoma versus metastatic disease   Palliative medicine was consulted for assisting with goals of care conversations.  Subjective:  Extensive chart review has been completed prior to meeting patient including labs, vital signs, imaging, progress notes, orders, and available advanced directive documents from current and previous encounters.  Introduced myself as a Publishing rights manager as a member of the palliative care team. Explained palliative medicine is specialized medical care for people living with serious illness. It focuses on providing relief from the symptoms and stress of a serious illness. The goal is to improve quality of life for both the patient and the family.   Visited with Ms. Belinda Gonzalez at her bedside. Awake and alert, eating breakfast, remains pleasantly confused. Daughter-Belinda Gonzalez at bedside, reports Ms. Belinda Gonzalez had difficulty sleeping last night with increased confusion and agitation. Shares that Ms. Belinda Gonzalez takes Melatonin at night to help with insomnia.  Belinda Gonzalez able to share a brief life review for Ms. Belinda Gonzalez. She and Belinda Gonzalez-husband have been married for many years. Daughter-in-law Belinda Gonzalez is Ms. Belinda Gonzalez's primary caretaker in the home. Prior to acute illness, Ms. Belinda Gonzalez required minimal assistance with completing  ADL's. Since onset of acute illness, Ms. Belinda Gonzalez has been dependent on Belinda Gonzalez to assist with ADL's and has remained bed bound. [place] of state but makes frequent visits and has frequent contact with her family.  Belinda Gonzalez shares her understanding of current medical situation. UTI and findings of previous stroke. Shares her understanding of recent findings of enlarged lymph nodes. As a family, they wish for Ms. Belinda Gonzalez to undergo biopsy for information gathering. As a family, they have decided not to pursue aggressive medical treatment or interventions based off biopsy results.  As a family, they hope for Ms. Belinda Gonzalez to have short term therapy/rehab to regain strength in order to be able to return home. Based on biopsy results they have also expressed interest in Hospice services at that time.  We discussed process of qualifying for short-term rehab such as therapy recommendations and insurance approval. Their main concern is their ability to care for Ms. Belinda Gonzalez if she returns home.  Will attempt to meet and/or communicate with Belinda Gonzalez-spouse later today or through the weekend.  We discussed the philosophy of hospice services. Belinda Gonzalez expressed her understanding and again shares based on biopsy results family may be interested in hospice services at that time.  I discussed importance of continued conversations with family/support persons and all members of their medical team regarding overall plan of care and treatment options ensuring decisions are in alignment with patients goals of care.  All questions/concerns addressed. Emotional support provided to patient/family/support persons. PMT will continue to follow and support patient as needed.  Objective: Primary Diagnoses: Present on Admission:  Confusion and disorientation  Cystitis  Essential hypertension  DNR (do not resuscitate)  Acquired hypothyroidism  Lumbar stenosis   Physical Exam Constitutional:      General: She is not in  acute distress.    Appearance: She is ill-appearing.  Pulmonary:     Effort: Pulmonary effort is normal. No respiratory distress.  Skin:    General: Skin is warm and dry.  Neurological:     Mental Status: She is alert.     Motor: Weakness present.     Vital Signs: BP (!) 115/56 (BP Location: Right Arm)   Pulse (!) 55   Temp 98.5 F (36.9 C) (Oral)   Resp 17   Ht 5\' 5"  (1.651 m)   Wt 77.6 kg   SpO2 100%   BMI 28.47 kg/m  Pain Scale: PAINAD   Pain Score: 0-No pain   LBM: Last BM Date : 04/16/22 Baseline Weight: Weight: 75.6 kg Most recent weight: Weight: 77.6 kg       Palliative Assessment/Data: 30%   Assessment and Plan  SUMMARY OF RECOMMENDATIONS   DNR Pending biopsy scheduled for Monday Ongoing GOC needed based on biopsy report Recommend Melatonin qhs for insomnia   Thank you for this consult and allowing Palliative Medicine to participate in the care of Belinda Gonzalez. Palliative medicine will continue to follow and assist as needed.   Time Total: 55 minutes  Time spent includes: Detailed review of medical records (labs, imaging, vital signs), medically appropriate exam (mental status, respiratory, cardiac, skin), discussed with treatment team, counseling and educating patient, family and staff, documenting clinical information, medication management and coordination of care.   Signed by: Belinda Deed, DNP, AGNP-C Palliative Medicine    Please contact Palliative Medicine Team phone at [phone] for questions and concerns.  For individual provider: See Loretha Stapler

## 2022-04-19 NOTE — Progress Notes (Signed)
[facility] Progress Note  Patient: Belinda Gonzalez    ZOX:[id number]  DOA: 04/16/2022     Date of Service: the patient was seen and examined on 04/19/2022  Chief Complaint  Patient presents with   Nausea   Brief hospital course: Belinda Gonzalez is an 88 y.o. female with PMH of hypertension, paroxysmal A-fib, CVA, hypothyroid, COPD, Alzheimer's dementia as reviewed from EMR who presented at [facility] ED with complaining of generalized weakness, decreased oral intake and ambulation for past 3 days.  Patient had nausea vomiting and diarrhea for 3 days which resolved and after that patient developed generalized weakness possible dehydration and feeling very weak, unable to do ADLs what she was doing at baseline.  Currently patient is bedbound unable to ambulate.  No behavioral issues. ED workup CT head and MRI brain shows chronic CVA, no acute findings. UA positive for UTI, CBC and CMP within normal range Patient was admitted under [facility] service for further management as below.  Assessment and Plan:  UTI, UA positive Continue ceftriaxone 1 g IV daily Follow urine culture, still in process   Hypokalemia, unknown cause, potassium repleted. Monitor electrolytes.   Generalized weakness secondary to dehydration and recent acute gastroenteritis Continue gentle hydration with IV fluid and continue oral hydration CT head and MRI brain showed chronic CVA.  Patient does not have any focal deficits.  No acute findings on imaging Continue supportive care, aspiration precaution and fall precautions Ambulate with assistance. Follow PT/OT and TOC for placement  Retroperitoneal lymphadenopathy, and cervical lymphadenopathy possible lymphoma vs mets Ct Chest shows cervical biopsy, no involvement of lungs. Family agreed for biopsy IR consulted for ultrasound-guided biopsy of cervical lymph node, which has been scheduled on Monday Patient will follow-up with oncology as an outpatient in 2 to 3 weeks to  discuss the biopsy report. Keep n.p.o. after midnight on Sunday for biopsy on Monday  HTN, proximal A-fib Resumed lisinopril 10 mg p.o. twice daily home dose Rate is controlled, intermittent bradycardia noticed Continue to monitor BP and titrate medications accordingly  Chronic CVA, incidental finding on CT and MRI brain Continue aspirin and statin  Depression and Alzheimer's dementia, continued Namenda and Exelon patch Continued Cymbalta Hypothyroid, continued Synthroid  Sundowning and insomnia, started Seroquel 50 mg every evening and melatonin 5 mg nightly  Body mass index is 27.81 kg/m.  Interventions:     Diet: Regular diet DVT Prophylaxis: Subcutaneous Heparin    Advance goals of care discussion: DNR  Family Communication: family was present at bedside, at the time of interview.  The pt provided permission to discuss medical plan with the family. Opportunity was given to ask question and all questions were answered satisfactorily.   Disposition:  Pt is from Home, admitted with generalized weakness and UTI, incidental finding of lymphadenopathy, awaiting for ultrasound-guided biopsy by IR, PT/OT eval, which precludes a safe discharge. Discharge to most likely SNF, TBD after PT/OT eval. discharge most likely in 1 to 2 days  Subjective: No significant events overnight, as per patient's family at bedside patient had very rough night, decreased sleep and pulled IV out.  It seems patient was having sundowning.  Requesting for sleep aid.  Patient has significant dementia, very hard of hearing.  Patient stated that she is feeling good, denies any complaints, seems to be resting comfortably. Biopsy has been planned on Monday.  Physical Exam: General: NAD, lying comfortably Appear in no distress, affect appropriate Eyes: PERRLA ENT: Oral Mucosa Clear, moist  Neck: no JVD,  Cardiovascular: S1 and S2 Present, no Murmur,  Respiratory: good respiratory effort, Bilateral Air entry  equal and Decreased, no Crackles, no wheezes Abdomen: Bowel Sound present, Soft and no tenderness,  Skin: no rashes Extremities: no Pedal edema, no calf tenderness Neurologic: without any new focal findings Gait not checked due to patient safety concerns  Vitals:   04/18/22 1600 04/18/22 1940 04/19/22 0500 04/19/22 0700  BP:  113/60 116/60 (!) 115/56  Pulse: (!) 58 (!) 57 62 (!) 55  Resp:  16 17 17   Temp:  98.2 F (36.8 C) 98.6 F (37 C) 98.5 F (36.9 C)  TempSrc:  Oral Oral Oral  SpO2:  99%  100%  Weight:      Height:        Intake/Output Summary (Last 24 hours) at 04/19/2022 1218 Last data filed at 04/19/2022 1100 Gross per 24 hour  Intake 180 ml  Output --  Net 180 ml   Filed Weights   04/17/22 0012 04/17/22 0500 04/18/22 0500  Weight: 75.6 kg 75.8 kg 77.6 kg    Data Reviewed: I have personally reviewed and interpreted daily labs, tele strips, imagings as discussed above. I reviewed all nursing notes, pharmacy notes, vitals, pertinent old records I have discussed plan of care as described above with RN and patient/family.  CBC: Recent Labs  Lab 04/16/22 1809 04/17/22 0409 04/18/22 0308 04/19/22 0640  WBC 9.0 7.2 5.6 6.3  NEUTROABS 6.5  --   --   --   HGB 12.0 11.2* 9.9* 9.8*  HCT 39.5 36.2 32.1* 31.4*  MCV 82.8 81.0 82.1 81.3  PLT 363 289 235 251   Basic Metabolic Panel: Recent Labs  Lab 04/16/22 1809 04/17/22 0409 04/18/22 0308 04/19/22 0640  NA 138 140 135 137  K 3.8 3.6 3.5 3.1*  CL 105 108 102 105  CO2 23 24 24 24   GLUCOSE 117* 95 87 116*  BUN 21 15 10  6*  CREATININE 0.89 0.68 0.64 0.70  CALCIUM 9.6 8.9 8.6* 8.4*  MG  --  2.0 1.8 1.9  PHOS  --  2.6 2.9 2.8    Studies: No results found.  Scheduled Meds:  aspirin EC  81 mg Oral Daily   DULoxetine  30 mg Oral Daily   heparin  5,000 Units Subcutaneous Q8H   levothyroxine  75 mcg Oral Q0600   lisinopril  10 mg Oral BID   memantine  10 mg Oral BID   oxybutynin  5 mg Oral BID    pantoprazole (PROTONIX) IV  40 mg Intravenous Q12H   potassium chloride  40 mEq Oral Q4H   rivastigmine  4.6 mg Transdermal Daily   rosuvastatin  10 mg Oral QHS   sodium chloride flush  3 mL Intravenous Q12H   Continuous Infusions:  cefTRIAXone (ROCEPHIN)  IV 1 g (04/18/22 2112)   PRN Meds: acetaminophen **OR** acetaminophen  Time spent: 35 minutes  Author: Gillis Santa. MD [facility] 04/19/2022 12:18 PM  To reach On-call, see care teams to locate the attending and reach out to them via [email or web address]. If 7PM-7AM, please contact night-coverage If you still have difficulty reaching the attending provider, please page the [facility] (Director on Call) for [facility] on amion for assistance.

## 2022-04-19 NOTE — Progress Notes (Signed)
On call provider notification [id number]  S:  Daughter at bedside requesting something to help patient have BM.  No BM since admission 4/10.  B:  86 F DNR with PMH of hypertension, paroxysmal A-fib, CVA, hypothyroid, COPD, Alzheimer's dementia here for generalized weakness, decreased oral intake and ambulation for past 3 days. Patient had NVD for 3 days which resolved and after that patient developed generalized weakness possible dehydration and feeling very weak, unable to do ADLs what she was doing at baseline. UA + UTI. Retroperitoneal lymphadenopathy, and cervical lymphadenopathy possible lymphoma vs mets-Ct Chest shows cervical biopsy, no involvement of lungs. Family agreed for biopsy.  IR consulted for ultrasound-guided biopsy of cervical lymph node, which has been scheduled on Monday  A:  Appetite good per daughter's report.  Bowel sounds present and active.  Soft, non distended.  R:  [id number] Response-New orders received:  bisacodyl suppository 10 mg per rectum daily PRN; Senna-docusate 2 tablet Once

## 2022-04-20 DIAGNOSIS — N390 Urinary tract infection, site not specified: Secondary | ICD-10-CM | POA: Diagnosis not present

## 2022-04-20 DIAGNOSIS — R590 Localized enlarged lymph nodes: Secondary | ICD-10-CM | POA: Diagnosis not present

## 2022-04-20 DIAGNOSIS — Z7189 Other specified counseling: Secondary | ICD-10-CM | POA: Diagnosis not present

## 2022-04-20 DIAGNOSIS — R410 Disorientation, unspecified: Secondary | ICD-10-CM | POA: Diagnosis not present

## 2022-04-20 LAB — CBC
HCT: 30.4 % — ABNORMAL LOW (ref 36.0–46.0)
Hemoglobin: 9.5 g/dL — ABNORMAL LOW (ref 12.0–15.0)
MCH: 25.6 pg — ABNORMAL LOW (ref 26.0–34.0)
MCHC: 31.3 g/dL (ref 30.0–36.0)
MCV: 81.9 fL (ref 80.0–100.0)
Platelets: 248 10*3/uL (ref 150–400)
RBC: 3.71 MIL/uL — ABNORMAL LOW (ref 3.87–5.11)
RDW: 15.9 % — ABNORMAL HIGH (ref 11.5–15.5)
WBC: 5.5 10*3/uL (ref 4.0–10.5)
nRBC: 0 % (ref 0.0–0.2)

## 2022-04-20 LAB — BASIC METABOLIC PANEL
Anion gap: 6 (ref 5–15)
BUN: 5 mg/dL — ABNORMAL LOW (ref 8–23)
CO2: 24 mmol/L (ref 22–32)
Calcium: 8.7 mg/dL — ABNORMAL LOW (ref 8.9–10.3)
Chloride: 108 mmol/L (ref 98–111)
Creatinine, Ser: 0.66 mg/dL (ref 0.44–1.00)
GFR, Estimated: >60 mL/min (ref >60)
Glucose, Bld: 94 mg/dL (ref 70–99)
Potassium: 3.6 mmol/L (ref 3.5–5.1)
Sodium: 138 mmol/L (ref 135–145)

## 2022-04-20 LAB — URINE CULTURE

## 2022-04-20 LAB — PHOSPHORUS: Phosphorus: 3 mg/dL (ref 2.5–4.6)

## 2022-04-20 LAB — MAGNESIUM: Magnesium: 1.8 mg/dL (ref 1.7–2.4)

## 2022-04-20 MED ORDER — QUETIAPINE FUMARATE 25 MG PO TABS: 25.0000 mg | ORAL_TABLET | Freq: Two times a day (BID) | ORAL | Status: DC | PRN

## 2022-04-20 NOTE — Progress Notes (Signed)
[facility] Progress Note  Patient: Belinda Gonzalez    WUJ:[id number]  DOA: 04/16/2022     Date of Service: the patient was seen and examined on 04/20/2022  Chief Complaint  Patient presents with   Nausea   Brief hospital course: Belinda Gonzalez is an 88 y.o. female with PMH of hypertension, paroxysmal A-fib, CVA, hypothyroid, COPD, Alzheimer's dementia as reviewed from EMR who presented at [facility] ED with complaining of generalized weakness, decreased oral intake and ambulation for past 3 days.  Patient had nausea vomiting and diarrhea for 3 days which resolved and after that patient developed generalized weakness possible dehydration and feeling very weak, unable to do ADLs what she was doing at baseline.  Currently patient is bedbound unable to ambulate.  No behavioral issues. ED workup CT head and MRI brain shows chronic CVA, no acute findings. UA positive for UTI, CBC and CMP within normal range Patient was admitted under [facility] service for further management as below.  Assessment and Plan:  UTI, UA positive Continue ceftriaxone 1 g IV daily Follow urine culture growing Enterococcus, sensitivity pending   Hypokalemia, unknown cause, potassium repleted. Monitor electrolytes.   Generalized weakness secondary to dehydration and recent acute gastroenteritis Continue gentle hydration with IV fluid and continue oral hydration CT head and MRI brain showed chronic CVA.  Patient does not have any focal deficits.  No acute findings on imaging Continue supportive care, aspiration precaution and fall precautions Ambulate with assistance. Follow PT/OT and TOC for placement  Retroperitoneal lymphadenopathy, and cervical lymphadenopathy possible lymphoma vs mets Ct Chest shows cervical biopsy, no involvement of lungs. Family agreed for biopsy IR consulted for ultrasound-guided biopsy of cervical lymph node, which has been scheduled on Monday Patient will follow-up with oncology as an  outpatient in 2 to 3 weeks to discuss the biopsy report. Keep n.p.o. after midnight on Sunday for biopsy on Monday  HTN, proximal A-fib Resumed lisinopril 10 mg p.o. twice daily home dose Rate is controlled, intermittent bradycardia noticed Continue to monitor BP and titrate medications accordingly  Chronic CVA, incidental finding on CT and MRI brain Continue aspirin and statin  Depression and Alzheimer's dementia, continued Namenda and Exelon patch Continued Cymbalta Hypothyroid, continued Synthroid  Sundowning and insomnia, started melatonin 5 mg nightly Changed Seroquel 25 mg po BID prn   Body mass index is 27.81 kg/m.  Interventions:     Diet: Regular diet DVT Prophylaxis: Subcutaneous Heparin    Advance goals of care discussion: DNR  Family Communication: family was present at bedside, at the time of interview.  The pt provided permission to discuss medical plan with the family. Opportunity was given to ask question and all questions were answered satisfactorily.  Palliative care consulted for goals of care discussion, patient may be appropriate for hospice down the road if no improvement  Disposition:  Pt is from Home, admitted with generalized weakness and UTI, incidental finding of lymphadenopathy, awaiting for ultrasound-guided biopsy by IR, PT/OT eval, which precludes a safe discharge. Discharge most likely to SNF, TBD after PT/OT eval. discharge most likely in 1 to 2 days after biopsy tomorrow a.m.  Subjective: No significant events overnight, patient is sleeping since yesterday evening, no sundowning.  Patient did not require any medication for sleep last night.  In the morning patient was in deep sleep, resting comfortably without any acute distress, unable to offer any complaint.  Family was at bedside.  Management plan discussed.   Physical Exam: General: NAD, lying comfortably, sleepy  Appear in no distress, affect appropriate Eyes: closed ENT: Oral Mucosa  Clear, moist  Neck: no JVD,  Cardiovascular: S1 and S2 Present, no Murmur,  Respiratory: good respiratory effort, Bilateral Air entry equal and Decreased, no Crackles, no wheezes Abdomen: Bowel Sound present, Soft and no tenderness,  Skin: no rashes Extremities: no Pedal edema, no calf tenderness Neurologic: without any new focal findings Gait not checked due to patient safety concerns  Vitals:   04/19/22 2114 04/20/22 0500 04/20/22 0519 04/20/22 0742  BP: 109/63  (!) 113/52 (!) 138/49  Pulse: (!) 55  (!) 57 (!) 54  Resp: Temp: (!) 97.4 F (36.3 C)  98.9 F (37.2 C) (!) 97.5 F (36.4 C)  TempSrc:      SpO2: 98%  99% 97%  Weight:  78.9 kg    Height:        Intake/Output Summary (Last 24 hours) at 04/20/2022 1151 Last data filed at 04/20/2022 0631 Gross per 24 hour  Intake --  Output 1500 ml  Net -1500 ml   Filed Weights   04/17/22 0500 04/18/22 0500 04/20/22 0500  Weight: 75.8 kg 77.6 kg 78.9 kg    Data Reviewed: I have personally reviewed and interpreted daily labs, tele strips, imagings as discussed above. I reviewed all nursing notes, pharmacy notes, vitals, pertinent old records I have discussed plan of care as described above with RN and patient/family.  CBC: Recent Labs  Lab 04/16/22 1809 04/17/22 0409 04/18/22 0308 04/19/22 0640 04/20/22 0450  WBC 9.0 7.2 5.6 6.3 5.5  NEUTROABS 6.5  --   --   --   --   HGB 12.0 11.2* 9.9* 9.8* 9.5*  HCT 39.5 36.2 32.1* 31.4* 30.4*  MCV 82.8 81.0 82.1 81.3 81.9  PLT 363 289 235 251 248   Basic Metabolic Panel: Recent Labs  Lab 04/16/22 1809 04/17/22 0409 04/18/22 0308 04/19/22 0640 04/20/22 0450  NA 138 140 135 137 138  K 3.8 3.6 3.5 3.1* 3.6  CL 105 108 102 105 108  CO2 GLUCOSE 117* 95 87 116* 94  BUN 6* 5*  CREATININE 0.89 0.68 0.64 0.70 0.66  CALCIUM 9.6 8.9 8.6* 8.4* 8.7*  MG  --  2.0 1.8 1.9 1.8  PHOS  --  2.6 2.9 2.8 3.0    Studies: No results found.  Scheduled  Meds:  aspirin EC  81 mg Oral Daily   DULoxetine  30 mg Oral Daily   heparin  5,000 Units Subcutaneous Q8H   levothyroxine  75 mcg Oral Q0600   lisinopril  10 mg Oral BID   melatonin  5 mg Oral QHS   memantine  10 mg Oral BID   oxybutynin  5 mg Oral BID   pantoprazole (PROTONIX) IV  40 mg Intravenous Q12H   QUEtiapine  50 mg Oral QPM   rivastigmine  4.6 mg Transdermal Daily   rosuvastatin  10 mg Oral QHS   sodium chloride flush  3 mL Intravenous Q12H   Continuous Infusions:  cefTRIAXone (ROCEPHIN)  IV 1 g (04/19/22 2017)   PRN Meds: acetaminophen **OR** acetaminophen, bisacodyl  Time spent: 35 minutes  Author: Gillis Santa. MD [facility] 04/20/2022 11:51 AM  To reach On-call, see care teams to locate the attending and reach out to them via [email or web address]. If 7PM-7AM, please contact night-coverage If you still have difficulty reaching the attending provider, please page the [facility] (Director on Call) for  [facility] on amion for assistance.

## 2022-04-20 NOTE — Progress Notes (Signed)
Physical Therapy Treatment Patient Details Name: Belinda Gonzalez MRN: [id number] DOB: September 10, 1934 Today's Date: 04/20/2022   History of Present Illness Pt is an 88 y.o. female brought by bedside for confusion more so than her usual baseline dementia.  Patient also reported to have nausea vomiting diarrhea. PMH of COPD, depression, HTN, thyroid disease.    PT Comments    Pt asleep in bed upon arrival.  Daughter in room and agrees to awaken pt for therapy.  BLE AA/PROM x 10 and cold cloth to attempt to awaken.  She opens eyes briefly but closes them again.  Assisted pt to sitting EOB with heavy mod a x 1 but once up she is awake and is able to sit unsupported for several minutes..  She is given a walker and she initiates standing with min a x 1 and transfers to recliner at bedside with mod a x 1 taking fair steps.  Once seated she is given a blanket and pulls it up and goes back to sleep.  She is positioned for comfort.  Daughter in room and updated.  ASCOM number left for her to call me if pt awakes more later for gait.   Recommendations for follow up therapy are one component of a multi-disciplinary discharge planning process, led by the attending physician.  Recommendations may be updated based on patient status, additional functional criteria and insurance authorization.  Follow Up Recommendations       Assistance Recommended at Discharge Frequent or constant Supervision/Assistance  Patient can return home with the following A little help with walking and/or transfers;Assistance with cooking/housework;Assist for transportation;Direct supervision/assist for medications management;Help with stairs or ramp for entrance;A little help with bathing/dressing/bathroom   Equipment Recommendations  None recommended by PT    Recommendations for Other Services       Precautions / Restrictions Precautions Precautions: Fall Restrictions Weight Bearing Restrictions: No     Mobility  Bed  Mobility Overal bed mobility: Needs Assistance Bed Mobility: Supine to Sit     Supine to sit: Mod assist          Transfers Overall transfer level: Needs assistance Equipment used: Rolling walker (2 wheels) Transfers: Sit to/from Stand Sit to Stand: Min assist   Step pivot transfers: Min assist, Mod assist       General transfer comment: some increased assist today but initiates task when asked    Ambulation/Gait               General Gait Details: deferred due to lethargy   Stairs             Wheelchair Mobility    Modified Rankin (Stroke Patients Only)       Balance Overall balance assessment: Needs assistance Sitting-balance support: Feet supported Sitting balance-Leahy Scale: Good Sitting balance - Comments: able to sit unsupported   Standing balance support: Bilateral upper extremity supported Standing balance-Leahy Scale: Poor Standing balance comment: +1 hands on for assist and safety                            Cognition Arousal/Alertness: Lethargic Behavior During Therapy: WFL for tasks assessed/performed Overall Cognitive Status: History of cognitive impairments - at baseline                                          Exercises Other Exercises  Other Exercises: BLE AAROM in attempts to awaken and engage pt    General Comments        Pertinent Vitals/Pain Pain Assessment Pain Assessment: No/denies pain    Home Living                          Prior Function            PT Goals (current goals can now be found in the care plan section) Progress towards PT goals: Progressing toward goals    Frequency    Min 2X/week      PT Plan Current plan remains appropriate    Co-evaluation              AM-PAC PT "6 Clicks" Mobility   Outcome Measure  Help needed turning from your back to your side while in a flat bed without using bedrails?: A Little Help needed moving from lying  on your back to sitting on the side of a flat bed without using bedrails?: A Little Help needed moving to and from a bed to a chair (including a wheelchair)?: A Little Help needed standing up from a chair using your arms (e.g., wheelchair or bedside chair)?: A Little Help needed to walk in hospital room?: A Lot Help needed climbing 3-5 steps with a railing? : A Lot 6 Click Score: 16    End of Session Equipment Utilized During Treatment: Gait belt Activity Tolerance: Patient tolerated treatment well Patient left: in chair;with chair alarm set;with call bell/phone within reach;with family/visitor present Nurse Communication: Mobility status;Other (comment) PT Visit Diagnosis: Other abnormalities of gait and mobility (R26.89);Muscle weakness (generalized) (M62.81)     Time: 9147-8295 PT Time Calculation (min) (ACUTE ONLY): 14 min  Charges:  $Therapeutic Activity: 8-22 mins                   Danielle Dess, PTA 04/20/22, 10:41 AM

## 2022-04-20 NOTE — Progress Notes (Signed)
[facility] [employer] [facility]) Hospital Liaison Note   Received request from PMT provider, Simonne Martinet., NP (Transitions of Care Manager, Carollee Herter, aware) to meet with patient at bedside to explain hospice services at home, ALF, or LTC. MSW met patient at bedside and contacted daughter/[name] via TC [phone]   & provided extensive education for hospice services.  Family requested additional time to consider options before proceeding. All questions answered and no concerns voiced.  At this time, patient is not under review for [facility] services as family continues to have ongoing GOC discussions. Family to F/U with [facility] or [facility] staff with final decision.  [facility] information and contact numbers given to family & above information shared with TOC.   Please call with any questions/concerns.    Thank you for the opportunity to participate in this patient's care.   Eugenie Birks, MSW [facility] Hospital Liaison  [phone]

## 2022-04-20 NOTE — Progress Notes (Signed)
Daily Progress Note   Patient Name: Belinda Gonzalez       Date: 04/20/2022 DOB: 10/29/1934  Age: 88 y.o. MRN#: [id number] Attending Physician: Gillis Santa, MD Primary Care Physician: Marisue Ivan, MD Admit Date: 04/16/2022  Reason for Consultation/Follow-up: Establishing goals of care  HPI/Brief Hospital Review:  88 y.o. female  with past medical history of Alzheimer's dementia, COPD, HTN, paroxsymal A. Fib. CVA and hypothyroidism admitted from home on 04/16/2022 with generalized weakness increased from baseline, decreased oral intake, NVD x 3 days.   Work-up revealed CT head/MRI chronic CVA-no acute findings UTI + Retroperitoneal lymphadenopathy concern for possible lymphoma versus metastatic disease    Palliative medicine was consulted for assisting with goals of care conversations.   Subjective: Extensive chart review has been completed prior to meeting patient including labs, vital signs, imaging, progress notes, orders, and available advanced directive documents from current and previous encounters.    Visited with Belinda Gonzalez at her bedside. Sitting in recliner, resting comfortably, does not respond to verbal stimulus.  Daughter-Belinda Gonzalez at bedside. Belinda Gonzalez Gonzalez that Belinda Gonzalez has been sleeping and less able to arouse since yesterday early evening. She did not have dinner and Belinda Gonzalez has been unable to wake her up enough to eat breakfast. Assisted to recliner by PT. Belinda Gonzalez Gonzalez this is not unusual behavior for her mom, at home Belinda Gonzalez typically sleeps 16-18 hours per day. Belinda Gonzalez Gonzalez that she does have "good days" such as she did yesterday with more periods of being awake and interactive.  Belinda Gonzalez Gonzalez that she and her family have accepted that Belinda Gonzalez's time remaining is  limited. They are interested in hospice services and speaking with hospice liaison regarding services offered and eligibility. Belinda Gonzalez was residing at home prior to admission with family care takers but unfortunately due to health issues of care takers family feels they may not be able to provide care in the home that will be required of them at discharge. 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 Belinda Gonzalez agrees to speaking with hospice liaison, requests they reach out to her as her dad-Belinda Gonzalez has difficulty communicating via phone and Belinda Gonzalez is currently caring for another family member.  At this time, Belinda Gonzalez continues to wish to  proceed with biopsy tomorrow pending stability of Belinda Gonzalez.  Contacted TOC to assist with coordination of hospice liaison meeting with family.  Objective:  Vital Signs: BP (!) 138/49 (BP Location: Left Leg)   Pulse (!) 54   Temp (!) 97.5 F (36.4 C)   Resp 16   Ht 5\' 5"  (1.651 m)   Wt 78.9 kg   SpO2 97%   BMI 28.95 kg/m  SpO2: SpO2: 97 % O2 Device: O2 Device: Room Air O2 Flow Rate:     Palliative Care Assessment & Plan   Assessment/Recommendation/Plan  DNR Continue current plan of care Spoke with nursing staff-recommended use of enema once Belinda Gonzalez back in bed to relieve constipation TOC coordinating contact with hospice liaison PMT to continue to follow for ongoing needs and support and coordination of care  Care plan was discussed with nursing staff, primary team and TOC.  Thank you for allowing the Palliative Medicine Team to assist in the care of this patient.  Total time:  35 minutes  Time spent includes: Detailed review of medical records (labs, imaging, vital signs), medically appropriate exam (mental status, respiratory, cardiac, skin), discussed with treatment team, counseling and educating patient, family and staff, documenting clinical  information, medication management and coordination of care.  Leeanne Deed, DNP, AGNP-C Palliative Medicine   Please contact Palliative Medicine Team phone at [phone] for questions and concerns.

## 2022-04-21 ENCOUNTER — Inpatient Hospital Stay: Payer: Medicare Other

## 2022-04-21 DIAGNOSIS — F03B0 Unspecified dementia, moderate, without behavioral disturbance, psychotic disturbance, mood disturbance, and anxiety: Secondary | ICD-10-CM | POA: Diagnosis not present

## 2022-04-21 DIAGNOSIS — Z66 Do not resuscitate: Secondary | ICD-10-CM | POA: Diagnosis not present

## 2022-04-21 DIAGNOSIS — R410 Disorientation, unspecified: Secondary | ICD-10-CM | POA: Diagnosis not present

## 2022-04-21 DIAGNOSIS — R590 Localized enlarged lymph nodes: Secondary | ICD-10-CM | POA: Diagnosis not present

## 2022-04-21 LAB — BASIC METABOLIC PANEL
Anion gap: 8 (ref 5–15)
BUN: 8 mg/dL (ref 8–23)
CO2: 26 mmol/L (ref 22–32)
Calcium: 8.4 mg/dL — ABNORMAL LOW (ref 8.9–10.3)
Chloride: 104 mmol/L (ref 98–111)
Creatinine, Ser: 0.68 mg/dL (ref 0.44–1.00)
GFR, Estimated: >60 mL/min (ref >60)
Glucose, Bld: 109 mg/dL — ABNORMAL HIGH (ref 70–99)
Potassium: 3.5 mmol/L (ref 3.5–5.1)
Sodium: 138 mmol/L (ref 135–145)

## 2022-04-21 LAB — CBC
HCT: 31.3 % — ABNORMAL LOW (ref 36.0–46.0)
Hemoglobin: 9.8 g/dL — ABNORMAL LOW (ref 12.0–15.0)
MCH: 25.6 pg — ABNORMAL LOW (ref 26.0–34.0)
MCHC: 31.3 g/dL (ref 30.0–36.0)
MCV: 81.7 fL (ref 80.0–100.0)
Platelets: 253 10*3/uL (ref 150–400)
RBC: 3.83 MIL/uL — ABNORMAL LOW (ref 3.87–5.11)
RDW: 16.2 % — ABNORMAL HIGH (ref 11.5–15.5)
WBC: 6.5 10*3/uL (ref 4.0–10.5)
nRBC: 0 % (ref 0.0–0.2)

## 2022-04-21 MED ORDER — LIDOCAINE HCL (PF) 1 % IJ SOLN
10.0000 mL | Freq: Once | INTRAMUSCULAR | Status: AC
  Administered 2022-04-21: 10 mL via INTRADERMAL

## 2022-04-21 NOTE — Progress Notes (Signed)
[facility] Progress Note  Patient: Belinda Gonzalez    EAV:[id number]  DOA: 04/16/2022     Date of Service: the patient was seen and examined on 04/21/2022  Chief Complaint  Patient presents with   Nausea   Brief hospital course: Belinda Gonzalez is an 88 y.o. female with PMH of hypertension, paroxysmal A-fib, CVA, hypothyroid, COPD, Alzheimer's dementia as reviewed from EMR who presented at [facility] ED with complaining of generalized weakness, decreased oral intake and ambulation for past 3 days.  Patient had nausea vomiting and diarrhea for 3 days which resolved and after that patient developed generalized weakness possible dehydration and feeling very weak, unable to do ADLs what she was doing at baseline.  Currently patient is bedbound unable to ambulate.  No behavioral issues. ED workup CT head and MRI brain shows chronic CVA, no acute findings. UA positive for UTI, CBC and CMP within normal range Patient was admitted under [facility] service for further management as below.  Assessment and Plan:  UTI, UA positive S/p ceftriaxone 1 g IV daily x 5 days, completed course. Follow urine culture growing Aerococcus     Hypokalemia, unknown cause, potassium repleted. Monitor electrolytes.   Generalized weakness secondary to dehydration and recent acute gastroenteritis Continue gentle hydration with IV fluid and continue oral hydration CT head and MRI brain showed chronic CVA.  Patient does not have any focal deficits.  No acute findings on imaging Continue supportive care, aspiration precaution and fall precautions Ambulate with assistance. Follow PT/OT and TOC for placement  Retroperitoneal lymphadenopathy, and cervical lymphadenopathy possible lymphoma vs mets Ct Chest shows cervical biopsy, no involvement of lungs. Family agreed for biopsy IR consulted for ultrasound-guided biopsy of cervical lymph node, which has been scheduled on Monday Patient will follow-up with oncology as an  outpatient in 2 to 3 weeks to discuss the biopsy report. Patient is n.p.o. since midnight for biopsy today  HTN, proximal A-fib Resumed lisinopril 10 mg p.o. twice daily home dose Rate is controlled, intermittent bradycardia noticed Continue to monitor BP and titrate medications accordingly  Chronic CVA, incidental finding on CT and MRI brain Continue aspirin and statin  Depression and Alzheimer's dementia, continued Namenda and Exelon patch Continued Cymbalta Hypothyroid, continued Synthroid  Sundowning and insomnia, started melatonin 5 mg nightly Changed Seroquel 25 mg po BID prn   Body mass index is 27.81 kg/m.  Interventions:     Diet: Regular diet DVT Prophylaxis: Subcutaneous Heparin    Advance goals of care discussion: DNR  Family Communication: family was present at bedside, at the time of interview.  The pt provided permission to discuss medical plan with the family. Opportunity was given to ask question and all questions were answered satisfactorily.  Palliative care consulted for goals of care discussion, patient may be appropriate for hospice down the road if no improvement  Disposition:  Pt is from Home, admitted with generalized weakness and UTI, incidental finding of lymphadenopathy, awaiting for ultrasound-guided biopsy by IR, PT/OT eval, which precludes a safe discharge. Discharge most likely to SNF, TBD after PT/OT eval. discharge when bed will be available.  Clinically stable, medically optimized..  Subjective: No significant events overnight, patient was sleepy.  As per patient's family at bedside yesterday she was sleepy as well in the morning and then she woke up, no any active issues.  They are awaiting for biopsy today and PT and OT eval and placement.  No any other concerns.    Physical Exam: General: NAD, lying  comfortably, sleepy  Appear in no distress, affect appropriate Eyes: closed ENT: Oral Mucosa Clear, moist  Neck: no JVD,  Cardiovascular:  S1 and S2 Present, no Murmur,  Respiratory: good respiratory effort, Bilateral Air entry equal and Decreased, no Crackles, no wheezes Abdomen: Bowel Sound present, Soft and no tenderness,  Skin: no rashes Extremities: no Pedal edema, no calf tenderness Neurologic: without any new focal findings Gait not checked due to patient safety concerns  Vitals:   04/20/22 1933 04/21/22 0719 04/21/22 1126 04/21/22 1146  BP: 120/76 138/82 111/65 123/62  Pulse: (!) 41 (!) 56 (!) 54 (!) 50  Resp: 16 18 18 18   Temp: 98.7 F (37.1 C) 99.5 F (37.5 C)    TempSrc: Oral Oral    SpO2: 97% 96% 97% 94%  Weight:      Height:       No intake or output data in the 24 hours ending 04/21/22 1153  Filed Weights   04/17/22 0500 04/18/22 0500 04/20/22 0500  Weight: 75.8 kg 77.6 kg 78.9 kg    Data Reviewed: I have personally reviewed and interpreted daily labs, tele strips, imagings as discussed above. I reviewed all nursing notes, pharmacy notes, vitals, pertinent old records I have discussed plan of care as described above with RN and patient/family.  CBC: Recent Labs  Lab 04/16/22 1809 04/17/22 0409 04/18/22 0308 04/19/22 0640 04/20/22 0450 04/21/22 0353  WBC 9.0 7.2 5.6 6.3 5.5 6.5  NEUTROABS 6.5  --   --   --   --   --   HGB 12.0 11.2* 9.9* 9.8* 9.5* 9.8*  HCT 39.5 36.2 32.1* 31.4* 30.4* 31.3*  MCV 82.8 81.0 82.1 81.3 81.9 81.7  PLT 363 289 235 251 248 253   Basic Metabolic Panel: Recent Labs  Lab 04/17/22 0409 04/18/22 0308 04/19/22 0640 04/20/22 0450 04/21/22 0353  NA 140 135 137 138 138  K 3.6 3.5 3.1* 3.6 3.5  CL 108 102 105 108 104  CO2 24 24 24 24 26   GLUCOSE 95 87 116* 94 109*  BUN 15 10 6* 5* 8  CREATININE 0.68 0.64 0.70 0.66 0.68  CALCIUM 8.9 8.6* 8.4* 8.7* 8.4*  MG 2.0 1.8 1.9 1.8  --   PHOS 2.6 2.9 2.8 3.0  --     Studies: No results found.  Scheduled Meds:  aspirin EC  81 mg Oral Daily   DULoxetine  30 mg Oral Daily   heparin  5,000 Units Subcutaneous Q8H    levothyroxine  75 mcg Oral Q0600   lisinopril  10 mg Oral BID   melatonin  5 mg Oral QHS   memantine  10 mg Oral BID   oxybutynin  5 mg Oral BID   pantoprazole (PROTONIX) IV  40 mg Intravenous Q12H   rivastigmine  4.6 mg Transdermal Daily   rosuvastatin  10 mg Oral QHS   sodium chloride flush  3 mL Intravenous Q12H   Continuous Infusions:  cefTRIAXone (ROCEPHIN)  IV 1 g (04/20/22 1940)   PRN Meds: acetaminophen **OR** acetaminophen, bisacodyl, QUEtiapine  Time spent: 35 minutes  Author: Gillis Santa. MD [facility] 04/21/2022 11:53 AM  To reach On-call, see care teams to locate the attending and reach out to them via [email or web address]. If 7PM-7AM, please contact night-coverage If you still have difficulty reaching the attending provider, please page the [facility] (Director on Call) for [facility] on amion for assistance.

## 2022-04-21 NOTE — Progress Notes (Addendum)
Palliative Care Progress Note, Assessment & Plan   Patient Name: Belinda Gonzalez       Date: 04/21/2022 DOB: [birth year]  Age: 88 y.o. MRN#: [id number] Attending Physician: Gillis Santa, MD Primary Care Physician: Marisue Ivan, MD Admit Date: 04/16/2022  Subjective: Patient is lying in bed in no apparent distress.  She acknowledges my presence.  She is working with nursing staff to be readjusted in bed.  I met with patient's husband and daughter Belinda Gonzalez at bedside to discuss boundaries and goals of care.  HPI: 88 y.o. female  with past medical history of Alzheimer's dementia, COPD, HTN, paroxsymal A. Fib. CVA and hypothyroidism admitted from home on 04/16/2022 with generalized weakness increased from baseline, decreased oral intake, NVD x 3 days.   Work-up revealed CT head/MRI chronic CVA-no acute findings UTI + Retroperitoneal lymphadenopathy concern for possible lymphoma versus metastatic disease    Palliative medicine was consulted for assisting with goals of care conversations  Summary of counseling/coordination of care: After reviewing the patient's chart and assessing the patient at bedside, I met with patient's spouse and daughter Belinda Gonzalez at bedside to discuss boundaries and goals of care.  We reviewed patient's functional, nutritional, and cognitive status.  Reviewed dementia as a progressive, irreversible disease that oftentimes is accelerated with acute illnesses and hospitalizations.  I attempted to elicit goals and values important to patient and family.  Patient is unable to participate in discussions.  Patient spouse and daughter shared they would like to focus on patient's quality of life.  However, they would also like to see if she could build up strength to allow for more ease of  transfer and assistance with ADLs at home.  Ideally, family would like for patient to come home and be strong enough to not require 24/7 nursing.  However, patient shares that over the last month that patient's needs have exceeded family's ability to appropriately manage her.  Family shares concern they do not have enough nursing care at home given patient's recent decline.  We reviewed PT/OT evaluations are ongoing.  TOC following closely for discharge.  Family shares they are ready to discuss hospice.  Hospice philosophy, inpatient hospice, and hospice at home discussed briefly.  Watt Climes from [facility] plans to continue these discussions with family tomorrow.  Family is not prepared to make a decision at this time in regards to change to plan of care.  They are still wanting to hear what PT/OT's recommendations are as well as options as far as hospice.  Symptom burden is low.  No need for adjustments to medications at this time.  Code status confirmed with spouse and daughter.  DNR remains.  PMT will continue to follow and support patient and family throughout her hospitalization.  Physical Exam Constitutional:      General: She is not in acute distress.    Appearance: She is not ill-appearing.  HENT:     Head: Normocephalic.     Mouth/Throat:     Mouth: Mucous membranes are moist.  Cardiovascular:     Rate and Rhythm: Normal rate.  Pulmonary:     Effort: Pulmonary effort is normal.  Abdominal:     Palpations: Abdomen is soft.  Musculoskeletal:  Comments: Generalized weakness  Skin:    General: Skin is warm and dry.  Neurological:     Mental Status: She is alert.             Total Time 35 minutes   Kati Riggenbach L. Manon Hilding, FNP-BC Palliative Medicine Team Team Phone # [phone]

## 2022-04-21 NOTE — Progress Notes (Signed)
[facility] [employer] [facility]) Hospital Liaison Note  [facility] liaison continues to follow peripherally, awaiting family's decisions on care and services going forward.    Please do not hesitate to cal with any questions or concerns.    Thank you  [facility] Liaison [phone]

## 2022-04-22 DIAGNOSIS — N390 Urinary tract infection, site not specified: Secondary | ICD-10-CM | POA: Diagnosis not present

## 2022-04-22 DIAGNOSIS — R590 Localized enlarged lymph nodes: Secondary | ICD-10-CM | POA: Diagnosis not present

## 2022-04-22 DIAGNOSIS — F03B0 Unspecified dementia, moderate, without behavioral disturbance, psychotic disturbance, mood disturbance, and anxiety: Secondary | ICD-10-CM | POA: Diagnosis not present

## 2022-04-22 DIAGNOSIS — R410 Disorientation, unspecified: Secondary | ICD-10-CM | POA: Diagnosis not present

## 2022-04-22 LAB — CBC
HCT: 32.1 % — ABNORMAL LOW (ref 36.0–46.0)
Hemoglobin: 9.9 g/dL — ABNORMAL LOW (ref 12.0–15.0)
MCH: 25.4 pg — ABNORMAL LOW (ref 26.0–34.0)
MCHC: 30.8 g/dL (ref 30.0–36.0)
MCV: 82.3 fL (ref 80.0–100.0)
Platelets: 246 10*3/uL (ref 150–400)
RBC: 3.9 MIL/uL (ref 3.87–5.11)
RDW: 16.2 % — ABNORMAL HIGH (ref 11.5–15.5)
WBC: 6.3 10*3/uL (ref 4.0–10.5)
nRBC: 0 % (ref 0.0–0.2)

## 2022-04-22 LAB — BASIC METABOLIC PANEL
Anion gap: 8 (ref 5–15)
BUN: 7 mg/dL — ABNORMAL LOW (ref 8–23)
CO2: 30 mmol/L (ref 22–32)
Calcium: 8.6 mg/dL — ABNORMAL LOW (ref 8.9–10.3)
Chloride: 100 mmol/L (ref 98–111)
Creatinine, Ser: 0.66 mg/dL (ref 0.44–1.00)
GFR, Estimated: >60 mL/min (ref >60)
Glucose, Bld: 105 mg/dL — ABNORMAL HIGH (ref 70–99)
Potassium: 3.5 mmol/L (ref 3.5–5.1)
Sodium: 138 mmol/L (ref 135–145)

## 2022-04-22 MED ORDER — PANTOPRAZOLE SODIUM 40 MG PO TBEC
40.0000 mg | DELAYED_RELEASE_TABLET | Freq: Two times a day (BID) | ORAL | Status: DC
  Administered 2022-04-22 – 2022-04-24 (×4): 40 mg via ORAL
  Filled 2022-04-22 (×4): qty 1

## 2022-04-22 MED ORDER — PANTOPRAZOLE SODIUM 40 MG PO TBEC: 40.0000 mg | DELAYED_RELEASE_TABLET | Freq: Two times a day (BID) | ORAL | Status: DC

## 2022-04-22 MED ORDER — POTASSIUM CHLORIDE CRYS ER 20 MEQ PO TBCR
40.0000 meq | EXTENDED_RELEASE_TABLET | Freq: Once | ORAL | Status: AC
  Administered 2022-04-22: 40 meq via ORAL
  Filled 2022-04-22: qty 2

## 2022-04-22 NOTE — Progress Notes (Signed)
[facility]) Hospital Liaison Note   Per [name]/daughter: [phone] request, family meeting held with spouse/James. Extensive education provided to both individuals. Family requested to be seen by PT to determine if patient is a candidate for rehab, home services, etc. Family also requesting to speak with TOC/Darrian regarding LTC options if placement needed.    Family requested additional time to consider options before proceeding. All questions answered and no concerns voiced.   At this time, patient is not under review for [facility] services as family continues to have ongoing GOC discussions. Family has requested to meet with MSW on 4.18 @ 11:30 with final decision of GOC.    [facility] information and contact numbers given to family & above information shared with TOC & [facility] IDT.   Please call with any questions/concerns.    Thank you for the opportunity to participate in this patient's care.   Eugenie Birks, MSW [facility] Hospital Liaison  [phone]

## 2022-04-22 NOTE — Progress Notes (Signed)
Physical Therapy Treatment Patient Details Name: Belinda Gonzalez MRN: [id number] DOB: 1934/09/06 Today's Date: 04/22/2022   History of Present Illness Pt is an 88 y.o. female brought by bedside for confusion more so than her usual baseline dementia.  Patient also reported to have nausea vomiting diarrhea. PMH of COPD, depression, HTN, thyroid disease.    PT Comments    Pt awake and alert.  Agrees to OOB.  To EOB with min a x 1 and is able to remain sitting unsupported x 5 minutes with no LOB and generally steady and safe.  She stands with min a x 1 for standing ex with walker x 5.  After short seated rest, stands and is able to walk 10' with RW and contact guard/min assist to navigate with walker.  Then stands again and walks to door and back with same assist.  Remains OOB in recliner after session with needs met and updated tech on mobility.   Recommendations for follow up therapy are one component of a multi-disciplinary discharge planning process, led by the attending physician.  Recommendations may be updated based on patient status, additional functional criteria and insurance authorization.  Follow Up Recommendations       Assistance Recommended at Discharge    Patient can return home with the following A little help with walking and/or transfers;Assistance with cooking/housework;Assist for transportation;Direct supervision/assist for medications management;Help with stairs or ramp for entrance;A little help with bathing/dressing/bathroom   Equipment Recommendations  None recommended by PT    Recommendations for Other Services       Precautions / Restrictions Precautions Precautions: Fall Restrictions Weight Bearing Restrictions: No     Mobility  Bed Mobility Overal bed mobility: Needs Assistance Bed Mobility: Sit to Supine     Supine to sit: Min assist       Patient Response: Cooperative  Transfers Overall transfer level: Needs assistance Equipment used: Rolling  walker (2 wheels) Transfers: Sit to/from Stand Sit to Stand: Min assist                Ambulation/Gait Ambulation/Gait assistance: Editor, commissioning (Feet): 25 Feet Assistive device: Rolling walker (2 wheels) Gait Pattern/deviations: Step-through pattern, Decreased step length - right, Decreased step length - left, Trunk flexed Gait velocity: dec     General Gait Details: 10' x 1 then 25' in room to door and back   Stairs             Wheelchair Mobility    Modified Rankin (Stroke Patients Only)       Balance Overall balance assessment: Needs assistance Sitting-balance support: Feet supported Sitting balance-Leahy Scale: Good Sitting balance - Comments: able to sit safely with distant supervision   Standing balance support: Bilateral upper extremity supported Standing balance-Leahy Scale: Fair Standing balance comment: +1 hands on for assist and safety                            Cognition Arousal/Alertness: Awake/alert Behavior During Therapy: WFL for tasks assessed/performed Overall Cognitive Status: History of cognitive impairments - at baseline                                          Exercises Other Exercises Other Exercises: standing ex with walker x 5    General Comments        Pertinent Vitals/Pain  Pain Assessment Pain Assessment: No/denies pain    Home Living                          Prior Function            PT Goals (current goals can now be found in the care plan section) Progress towards PT goals: Progressing toward goals    Frequency    Min 2X/week      PT Plan Current plan remains appropriate    Co-evaluation              AM-PAC PT "6 Clicks" Mobility   Outcome Measure  Help needed turning from your back to your side while in a flat bed without using bedrails?: A Little Help needed moving from lying on your back to sitting on the side of a flat bed without using  bedrails?: A Little Help needed moving to and from a bed to a chair (including a wheelchair)?: A Little Help needed standing up from a chair using your arms (e.g., wheelchair or bedside chair)?: A Little Help needed to walk in hospital room?: A Little Help needed climbing 3-5 steps with a railing? : A Lot 6 Click Score: 17    End of Session Equipment Utilized During Treatment: Gait belt   Patient left: in chair;with chair alarm set;with call bell/phone within reach Nurse Communication: Mobility status PT Visit Diagnosis: Other abnormalities of gait and mobility (R26.89);Muscle weakness (generalized) (M62.81)     Time: 1610-9604 PT Time Calculation (min) (ACUTE ONLY): 17 min  Charges:  $Gait Training: 8-22 mins                    Danielle Dess, PTA 04/22/22, 12:22 PM

## 2022-04-22 NOTE — Progress Notes (Signed)
Occupational Therapy Treatment Patient Details Name: Belinda Gonzalez MRN: [id number] DOB: 1934-07-23 Today's Date: 04/22/2022   History of present illness Pt is an 88 y.o. female brought by bedside for confusion more so than her usual baseline dementia.  Patient also reported to have nausea vomiting diarrhea. PMH of COPD, depression, HTN, thyroid disease.   OT comments  Patient received supine in bed and agreeable to OT with encouragement. Pt oriented to self and followed one step commands with VC this date. She required Min A to come to EOB. Pt then engaged in seated grooming tasks with set up-supervision. Mod VC required to initiate. Pt was instructed in seated BUE AROM exercises (see details below). Multimodal cues required for proper technique. She was able to complete lateral scooting along EOB with Min guard before returning to supine. Pt left as received with all needs in reach. Pt is making progress toward goal completion. D/C recommendation remains appropriate. OT will continue to follow acutely.    Recommendations for follow up therapy are one component of a multi-disciplinary discharge planning process, led by the attending physician.  Recommendations may be updated based on patient status, additional functional criteria and insurance authorization.    Assistance Recommended at Discharge Frequent or constant Supervision/Assistance  Patient can return home with the following  A little help with walking and/or transfers;A little help with bathing/dressing/bathroom;Help with stairs or ramp for entrance;Assistance with cooking/housework;Assist for transportation;Direct supervision/assist for financial management;Direct supervision/assist for medications management   Equipment Recommendations  BSC/3in1    Recommendations for Other Services      Precautions / Restrictions Precautions Precautions: Fall Restrictions Weight Bearing Restrictions: No       Mobility Bed Mobility Overal  bed mobility: Needs Assistance Bed Mobility: Sit to Supine, Supine to Sit     Supine to sit: Min assist, HOB elevated Sit to supine: Min guard        Transfers Overall transfer level: Needs assistance Equipment used: None Transfers: Bed to chair/wheelchair/BSC            Lateral/Scoot Transfers: Min guard General transfer comment: pt completed lateral scooting along EOB toward R side, Min VC for sequencing     Balance Overall balance assessment: Needs assistance Sitting-balance support: Feet supported Sitting balance-Leahy Scale: Good         Standing balance comment: deferred this date 2/2 low BP (100/51) and HR (57)         ADL either performed or assessed with clinical judgement   ADL Overall ADL's : Needs assistance/impaired     Grooming: Set up;Sitting;Brushing hair;Wash/dry face;Supervision/safety Grooming Details (indicate cue type and reason): Mod VC to initiate        Extremity/Trunk Assessment Upper Extremity Assessment Upper Extremity Assessment: Generalized weakness   Lower Extremity Assessment Lower Extremity Assessment: Generalized weakness        Vision Patient Visual Report: No change from baseline     Perception     Praxis      Cognition Arousal/Alertness: Awake/alert Behavior During Therapy: WFL for tasks assessed/performed, Flat affect Overall Cognitive Status: History of cognitive impairments - at baseline       General Comments: followed 1 step commands with cues, agreeable to participate in OT with encouragement        Exercises General Exercises - Upper Extremity Shoulder Flexion: AROM, Both, 10 reps, Seated Shoulder Horizontal ABduction: AROM, Both, 10 reps, Seated Shoulder Horizontal ADduction: AROM, Both, 10 reps, Seated Elbow Flexion: AROM, Both, 10 reps, Seated Elbow Extension:  AROM, Both, 10 reps, Seated    Shoulder Instructions       General Comments No sign/symptoms with activity depsite low BP and HR.  RN notified and requested OT hold off on functional transfers/mobility this date.    Pertinent Vitals/ Pain       Pain Assessment Pain Assessment: No/denies pain  Home Living          Prior Functioning/Environment              Frequency  Min 1X/week        Progress Toward Goals  OT Goals(current goals can now be found in the care plan section)  Progress towards OT goals: Progressing toward goals  Acute Rehab OT Goals Patient Stated Goal: to go to rehab OT Goal Formulation: With family Time For Goal Achievement: 05/01/22 Potential to Achieve Goals: Fair  Plan Discharge plan remains appropriate;Frequency remains appropriate    Co-evaluation                 AM-PAC OT "6 Clicks" Daily Activity     Outcome Measure   Help from another person eating meals?: A Little Help from another person taking care of personal grooming?: A Little Help from another person toileting, which includes using toliet, bedpan, or urinal?: A Little Help from another person bathing (including washing, rinsing, drying)?: A Little Help from another person to put on and taking off regular upper body clothing?: A Little Help from another person to put on and taking off regular lower body clothing?: A Little 6 Click Score: 18    End of Session    OT Visit Diagnosis: Other abnormalities of gait and mobility (R26.89);Muscle weakness (generalized) (M62.81)   Activity Tolerance Patient tolerated treatment well   Patient Left in bed;with call bell/phone within reach;with bed alarm set   Nurse Communication Mobility status;Other (comment) (vital signs)        Time: 1610-9604 OT Time Calculation (min): 11 min  Charges: OT General Charges $OT Visit: 1 Visit OT Treatments $Self Care/Home Management : 8-22 mins  [facility] MS, OTR/L ascom [phone]  04/22/22, 6:07 PM

## 2022-04-22 NOTE — Progress Notes (Signed)
                                                     Palliative Care Progress Note, Assessment & Plan   Patient Name: Belinda Gonzalez       Date: 04/22/2022 DOB: 1934-12-20  Age: 88 y.o. MRN#: [id number] Attending Physician: Gillis Santa, MD Primary Care Physician: Marisue Ivan, MD Admit Date: 04/16/2022  Subjective: Patient is lying in bed in no apparent distress.  She acknowledges my presence.  She is alert and oriented to self only.  No family or friends at bedside.  Patient denies pain or discomfort at this time.  HPI: 88 y.o. female  with past medical history of Alzheimer's dementia, COPD, HTN, paroxsymal A. Fib. CVA and hypothyroidism admitted from home on 04/16/2022 with generalized weakness increased from baseline, decreased oral intake, NVD x 3 days.   Work-up revealed CT head/MRI chronic CVA-no acute findings UTI + Retroperitoneal lymphadenopathy concern for possible lymphoma versus metastatic disease    Palliative medicine was consulted for assisting with goals of care conversations  Summary of counseling/coordination of care: After reviewing the patient's chart and assessing the patient at bedside, I spoke with patient's daughter [name] over the phone.  [name] confirmed that she and patient's husband met with hospice liaison this morning.  [name] shares she wants to continue to know what her mother's options are as far as PT evaluations and potential for discharge to SNF for rehab.  Goals and values inmportant to the patient and family discussed. [name] reiterates that she wants to ensure quality of life and what is best for the patient. She and her family are trying to balance what is best for patient as they do not yet know all of her options. We reviewed that PT evaluations are still pending and that TOC is following closely.   I attempted  to speak with PT in regards to patient's PT session today. No answer and I will continue to coordinate with medical team.   Ongoing discussions and support from PMT to continue.  Physical Exam Constitutional:      General: She is not in acute distress.    Appearance: She is normal weight. She is not ill-appearing.  HENT:     Head: Normocephalic.     Mouth/Throat:     Mouth: Mucous membranes are moist.     Pharynx: Oropharynx is clear.  Eyes:     Pupils: Pupils are equal, round, and reactive to light.  Cardiovascular:     Rate and Rhythm: Normal rate.     Pulses: Normal pulses.  Pulmonary:     Effort: Pulmonary effort is normal.  Abdominal:     Palpations: Abdomen is soft.  Skin:    General: Skin is warm and dry.  Neurological:     Mental Status: She is alert.     Comments: Oriented to self             Total Time 25 minutes   Pessy Delamar L. Manon Hilding, FNP-BC Palliative Medicine Team Team Phone # [phone]

## 2022-04-22 NOTE — Progress Notes (Signed)
[facility] Progress Note  Patient: Belinda Gonzalez    WUX:[id number]  DOA: 04/16/2022     Date of Service: the patient was seen and examined on 04/22/2022  Chief Complaint  Patient presents with   Nausea   Brief hospital course: CHIRSTINA HAAN is an 88 y.o. female with PMH of hypertension, paroxysmal A-fib, CVA, hypothyroid, COPD, Alzheimer's dementia as reviewed from EMR who presented at [facility] ED with complaining of generalized weakness, decreased oral intake and ambulation for past 3 days.  Patient had nausea vomiting and diarrhea for 3 days which resolved and after that patient developed generalized weakness possible dehydration and feeling very weak, unable to do ADLs what she was doing at baseline.  Currently patient is bedbound unable to ambulate.  No behavioral issues. ED workup CT head and MRI brain shows chronic CVA, no acute findings. UA positive for UTI, CBC and CMP within normal range Patient was admitted under [facility] service for further management as below.  Assessment and Plan:  UTI, UA positive S/p ceftriaxone 1 g IV daily x 5 days, completed course. Follow urine culture growing Aerococcus   Hypokalemia, unknown cause, potassium repleted. Monitor electrolytes.  Generalized weakness secondary to dehydration and recent acute gastroenteritis. S/p IVF for hydration, continue oral hydration CT head and MRI brain showed chronic CVA.  Patient does not have any focal deficits.  No acute findings on imaging Continue supportive care, aspiration precaution and fall precautions Ambulate with assistance. Follow PT/OT and TOC for placement  Retroperitoneal lymphadenopathy, and cervical lymphadenopathy possible lymphoma vs mets Ct Chest shows cervical biopsy, no involvement of lungs. Family agreed for biopsy IR consulted for ultrasound-guided biopsy of cervical lymph node, which has been scheduled on Monday Patient will follow-up with oncology as an outpatient in 2 to 3 weeks to  discuss the biopsy report. On 4/15 lymph node biopsy was done, report is pending  HTN, proximal A-fib Resumed lisinopril 10 mg p.o. twice daily home dose Rate is controlled, intermittent bradycardia noticed most likely due to sleep Continue to monitor BP and titrate medications accordingly  Chronic CVA, incidental finding on CT and MRI brain Continue aspirin and statin  Depression and Alzheimer's dementia, continued Namenda and Exelon patch Continued Cymbalta Hypothyroid, continued Synthroid  Sundowning and insomnia, started melatonin 5 mg nightly Changed Seroquel 25 mg po BID prn   Body mass index is 27.81 kg/m.  Interventions:     Diet: Regular diet DVT Prophylaxis: Subcutaneous Heparin    Advance goals of care discussion: DNR  Family Communication: family was present at bedside, at the time of interview.  The pt provided permission to discuss medical plan with the family. Opportunity was given to ask question and all questions were answered satisfactorily.  Palliative care consulted for goals of care discussion, patient may be appropriate for hospice down the road if no improvement  Disposition:  Pt is from Home, admitted with generalized weakness and UTI, incidental finding of lymphadenopathy, s/p ultrasound-guided biopsy by IR on 4/15, PT/OT eval, and follow TOC for discharge planning.  Patient is medically stable to be discharged.   Subjective: No significant events overnight, patient was awake and alert in the morning and eating breakfast, denied any active issues.  Seems to be resting comfortably.  Physical Exam: General: NAD, lying comfortably,  Appear in no distress, affect appropriate Eyes: PERRLA ENT: Oral Mucosa Clear, moist  Neck: no JVD,  Cardiovascular: S1 and S2 Present, no Murmur,  Respiratory: good respiratory effort, Bilateral Air entry equal  and Decreased, no Crackles, no wheezes Abdomen: Bowel Sound present, Soft and no tenderness,  Skin: no  rashes Extremities: no Pedal edema, no calf tenderness Neurologic: without any new focal findings Gait not checked due to patient safety concerns  Vitals:   04/21/22 1146 04/21/22 1638 04/22/22 0500 04/22/22 0801  BP: 123/62 (!) 151/82  117/62  Pulse: (!) 50 71  65  Resp: Temp:  99.4 F (37.4 C)  98 F (36.7 C)  TempSrc:    Oral  SpO2: 94% 99%  98%  Weight:   79.1 kg   Height:        Intake/Output Summary (Last 24 hours) at 04/22/2022 1131 Last data filed at 04/22/2022 1030 Gross per 24 hour  Intake 240 ml  Output 400 ml  Net -160 ml    Filed Weights   04/18/22 0500 04/20/22 0500 04/22/22 0500  Weight: 77.6 kg 78.9 kg 79.1 kg    Data Reviewed: I have personally reviewed and interpreted daily labs, tele strips, imagings as discussed above. I reviewed all nursing notes, pharmacy notes, vitals, pertinent old records I have discussed plan of care as described above with RN and patient/family.  CBC: Recent Labs  Lab 04/16/22 1809 04/17/22 0409 04/18/22 0308 04/19/22 0640 04/20/22 0450 04/21/22 0353 04/22/22 0509  WBC 9.0   < > 5.6 6.3 5.5 6.5 6.3  NEUTROABS 6.5  --   --   --   --   --   --   HGB 12.0   < > 9.9* 9.8* 9.5* 9.8* 9.9*  HCT 39.5   < > 32.1* 31.4* 30.4* 31.3* 32.1*  MCV 82.8   < > 82.1 81.3 81.9 81.7 82.3  PLT 363   < > 235 251 248 253 246   < > = values in this interval not displayed.   Basic Metabolic Panel: Recent Labs  Lab 04/17/22 0409 04/18/22 0308 04/19/22 0640 04/20/22 0450 04/21/22 0353 04/22/22 0509  NA 140 135 137 138 138 138  K 3.6 3.5 3.1* 3.6 3.5 3.5  CL 108 102 105 108 104 100  CO2 GLUCOSE 95 87 116* 94 109* 105*  BUN 15 10 6* 5* 8 7*  CREATININE 0.68 0.64 0.70 0.66 0.68 0.66  CALCIUM 8.9 8.6* 8.4* 8.7* 8.4* 8.6*  MG 2.0 1.8 1.9 1.8  --   --   PHOS 2.6 2.9 2.8 3.0  --   --     Studies: [place] CORE BIOPSY (LYMPH NODES)  Result Date: 04/21/2022 INDICATION: Cervical lymphadenopathy EXAM:  Ultrasound-guided core needle biopsy of left cervical lymph node MEDICATIONS: None. ANESTHESIA/SEDATION: Local analgesia COMPLICATIONS: None immediate. PROCEDURE: Informed written consent was obtained from the patient after a thorough discussion of the procedural risks, benefits and alternatives. All questions were addressed. Maximal Sterile Barrier Technique was utilized including caps, mask, sterile gowns, sterile gloves, sterile drape, hand hygiene and skin antiseptic. A timeout was performed prior to the initiation of the procedure. The patient was placed supine on the exam table. Ultrasound of the left neck demonstrated multiple enlarged cervical lymph nodes. Skin entry site was marked, and the overlying skin was prepped draped in the standard sterile fashion. Local analgesia was obtained with 1% lidocaine. Using ultrasound guidance, core needle biopsy was performed of the enlarged left cervical lymph node using an 18 gauge core biopsy device x6 total passes. Specimens were submitted in saline to pathology for further handling. Limited postprocedure imaging demonstrated  no hematoma. A clean dressing was placed after manual hemostasis. The patient tolerated the procedure well without immediate complication. IMPRESSION: Successful ultrasound-guided core needle biopsy of enlarged left cervical lymph node. Electronically Signed   By: Olive Bass M.D.   On: 04/21/2022 12:29    Scheduled Meds:  aspirin EC  81 mg Oral Daily   DULoxetine  30 mg Oral Daily   heparin  5,000 Units Subcutaneous Q8H   levothyroxine  75 mcg Oral Q0600   lisinopril  10 mg Oral BID   melatonin  5 mg Oral QHS   memantine  10 mg Oral BID   oxybutynin  5 mg Oral BID   pantoprazole  40 mg Oral BID   rivastigmine  4.6 mg Transdermal Daily   rosuvastatin  10 mg Oral QHS   sodium chloride flush  3 mL Intravenous Q12H   Continuous Infusions:   PRN Meds: acetaminophen **OR** acetaminophen, bisacodyl, QUEtiapine  Time spent: 35  minutes  Author: Gillis Santa. MD [facility] 04/22/2022 11:31 AM  To reach On-call, see care teams to locate the attending and reach out to them via [email or web address]. If 7PM-7AM, please contact night-coverage If you still have difficulty reaching the attending provider, please page the [facility] (Director on Call) for [facility] on amion for assistance.

## 2022-04-22 NOTE — TOC Progression Note (Signed)
Transition of Care [facility]) - Progression Note    Patient Details  Name: Belinda Gonzalez MRN: [id number] Date of Birth: Jul 11, 1934  [facility] CM/SW Contact  Allena Katz, LCSW Phone Number: 04/22/2022, 9:12 AM  Clinical Narrative:   Family meeting with [facility] today at 60.          Expected Discharge Plan and Services                                               Social Determinants of Health (SDOH) Interventions SDOH Screenings   Food Insecurity: No Food Insecurity (04/17/2022)  Housing: Low Risk  (04/17/2022)  Transportation Needs: No Transportation Needs (04/17/2022)  Utilities: Not At Risk (04/17/2022)  Tobacco Use: Medium Risk (04/17/2022)    Readmission Risk Interventions     No data to display

## 2022-04-22 NOTE — TOC Progression Note (Signed)
Transition of Care [facility]) - Progression Note    Patient Details  Name: Belinda Gonzalez MRN: [id number] Date of Birth: 1934-10-17  [facility]) CM/SW Contact  Allena Katz, LCSW Phone Number: 04/22/2022, 3:58 PM  Clinical Narrative:  CSW spoke with patients daughter donna about updated recommendations and that PT recommends pt go home with [facility]. [name] opts for home hospice and is requesting for discharge Thursday so she can coordinate someone to help move furniture to accommodate hospital bed. [name] states that they have aide services 5 days a week to assist at discharge.          Expected Discharge Plan and Services                                               Social Determinants of Health (SDOH) Interventions SDOH Screenings   Food Insecurity: No Food Insecurity (04/17/2022)  Housing: Low Risk  (04/17/2022)  Transportation Needs: No Transportation Needs (04/17/2022)  Utilities: Not At Risk (04/17/2022)  Tobacco Use: Medium Risk (04/17/2022)    Readmission Risk Interventions     No data to display

## 2022-04-23 ENCOUNTER — Encounter: Payer: Self-pay | Admitting: Internal Medicine

## 2022-04-23 DIAGNOSIS — R590 Localized enlarged lymph nodes: Secondary | ICD-10-CM | POA: Diagnosis not present

## 2022-04-23 DIAGNOSIS — R410 Disorientation, unspecified: Secondary | ICD-10-CM | POA: Diagnosis not present

## 2022-04-23 DIAGNOSIS — Z66 Do not resuscitate: Secondary | ICD-10-CM | POA: Diagnosis not present

## 2022-04-23 DIAGNOSIS — N390 Urinary tract infection, site not specified: Secondary | ICD-10-CM | POA: Diagnosis not present

## 2022-04-23 NOTE — Progress Notes (Signed)
[facility]) Hospital Liaison Note  Patient daughter/[name] contacted MSW on this date confirming that family would like to proceed with Hospice Services.  DME needs discussed. Patient has the following equipment in the home: N/a Patient requests the following equipment for delivery: Bed BST  Family has requested for DME to be delivered on 4.18 around 1 pm. Patient will require EMS transport once medically clear.   Address verified and is correct in the chart. [name] is the family member to contact to arrange time of equipment delivery.    Please send signed and completed DNR home with patient/family. Please provide prescriptions at discharge as needed to ensure ongoing symptom management.    [facility] information and contact numbers given to family & above information shared with TOC.   Please call with any questions/concerns.    Thank you for the opportunity to participate in this patient's care.   Eugenie Birks, MSW [facility] Hospital Liaison  [phone]

## 2022-04-23 NOTE — Progress Notes (Signed)
                                                     Palliative Care Progress Note, Assessment & Plan   Patient Name: Belinda Gonzalez       Date: 04/23/2022 DOB: August 15, 1934  Age: 88 y.o. MRN#: [id number] Attending Physician: Gillis Santa, MD Primary Care Physician: Marisue Ivan, MD Admit Date: 04/16/2022  Subjective: Patient is lying in bed in NAD. She acknowledges my presence and is able to make her wishes known. She has her bear that she refers to as her 'baby' in her arms.   HPI: 88 y.o. female  with past medical history of Alzheimer's dementia, COPD, HTN, paroxsymal A. Fib. CVA and hypothyroidism admitted from home on 04/16/2022 with generalized weakness increased from baseline, decreased oral intake, NVD x 3 days.   Work-up revealed CT head/MRI chronic CVA-no acute findings UTI + Retroperitoneal lymphadenopathy concern for possible lymphoma versus metastatic disease    Palliative medicine was consulted for assisting with goals of care conversations  Summary of counseling/coordination of care: After reviewing the patient's chart and assessing the patient at bedside, I spoke with patient's daughter [name] over the phone. We discussed goals and boundaries of care for [name].   Plan is to have patient discharge to home with hospice services. DME to be delivered. TOC following closely for needs.   Therapeutic silence and active listening provided for [name] to share her thoughts and emotions regarding current medical situation.  Emotional support provided. [name] shares that her immediate family is feeling good about patient coming home with hospice support. She is looking into utilizing LTC insurance to have additional help in the home.   [name] shares concern that extended family does not understand what hospice will mean for patient - ie she is  not at EOL. WE discussed aging in place with priorities quality over quatity of life.   I encouraged [name] to utilize Hospice SW/chaplaincy/RN/NAs when home to provide ongoing discussions and support for not just patient but for family support. She was appreciative of this guidance.   Goals are clear.  DNR remains.  PMT will shadow the patient and monitor her peripherally.  Please reengage with PMT if goals change, at patient/family's request, or if patient's health deteriorates during hospitalization.  [name] has PMT contact info and was encouraged to call for any future palliative needs while in the hospital.  Physical Exam Vitals reviewed.  Constitutional:      General: She is not in acute distress.    Appearance: She is normal weight. She is not ill-appearing.  HENT:     Mouth/Throat:     Mouth: Mucous membranes are moist.  Cardiovascular:     Rate and Rhythm: Normal rate.     Pulses: Normal pulses.  Pulmonary:     Effort: Pulmonary effort is normal.  Abdominal:     Palpations: Abdomen is soft.  Skin:    General: Skin is warm and dry.             Total Time 25 minutes   Fredi Geiler L. Manon Hilding, FNP-BC Palliative Medicine Team Team Phone # [phone]

## 2022-04-23 NOTE — Progress Notes (Signed)
[facility] Progress Note  Patient: Belinda Gonzalez    ZOX:[id number]  DOA: 04/16/2022     Date of Service: the patient was seen and examined on 04/23/2022  Chief Complaint  Patient presents with   Nausea   Brief hospital course: GENESI Gonzalez is an 88 y.o. female with PMH of hypertension, paroxysmal A-fib, CVA, hypothyroid, COPD, Alzheimer's dementia as reviewed from EMR who presented at [facility] ED with complaining of generalized weakness, decreased oral intake and ambulation for past 3 days.  Patient had nausea vomiting and diarrhea for 3 days which resolved and after that patient developed generalized weakness possible dehydration and feeling very weak, unable to do ADLs what she was doing at baseline.  Currently patient is bedbound unable to ambulate.  No behavioral issues. ED workup CT head and MRI brain shows chronic CVA, no acute findings. UA positive for UTI, CBC and CMP within normal range Patient was admitted under [facility] service for further management as below.  Assessment and Plan:  UTI, UA positive S/p ceftriaxone 1 g IV daily x 5 days, completed course. Follow urine culture growing Aerococcus   Hypokalemia, unknown cause, potassium repleted. Monitor electrolytes.  Generalized weakness secondary to dehydration and recent acute gastroenteritis. S/p IVF for hydration, continue oral hydration CT head and MRI brain showed chronic CVA.  Patient does not have any focal deficits.  No acute findings on imaging Continue supportive care, aspiration precaution and fall precautions Ambulate with assistance. Follow PT/OT and TOC for placement  Retroperitoneal lymphadenopathy, and cervical lymphadenopathy possible lymphoma vs mets Ct Chest shows cervical biopsy, no involvement of lungs. Family agreed for biopsy IR consulted for ultrasound-guided biopsy of cervical lymph node, which has been scheduled on Monday Patient will follow-up with oncology as an outpatient in 2 to 3 weeks to  discuss the biopsy report. On 4/15 lymph node biopsy was done, report is pending  HTN, proximal A-fib Resumed lisinopril 10 mg p.o. twice daily home dose Rate is controlled, intermittent bradycardia noticed most likely due to sleep Continue to monitor BP and titrate medications accordingly  Chronic CVA, incidental finding on CT and MRI brain Continue aspirin and statin  Depression and Alzheimer's dementia, continued Namenda and Exelon patch Continued Cymbalta Hypothyroid, continued Synthroid  Sundowning and insomnia, started melatonin 5 mg nightly Changed Seroquel 25 mg po BID prn   Body mass index is 27.81 kg/m.  Interventions:     Diet: Regular diet DVT Prophylaxis: Subcutaneous Heparin    Advance goals of care discussion: DNR  Family Communication: family was present at bedside, at the time of interview.  The pt provided permission to discuss medical plan with the family. Opportunity was given to ask question and all questions were answered satisfactorily.  Palliative care consulted for goals of care discussion, plan is to discharge home with hospice   Disposition:  Pt is from Home, admitted with generalized weakness and UTI, incidental finding of lymphadenopathy, s/p ultrasound-guided biopsy by IR on 4/15, PT/OT eval done recommended home health. Palliative care consulted, plan is to discharge her home with hospice tomorrow a.m.   Subjective: No significant events overnight, patient was resting comfortably, she was sleepy, woke up by calling her name.  Stated that she is feeling fine.  Denies any complaints.   Physical Exam: General: NAD, lying comfortably,  Appear in no distress, affect appropriate Eyes: PERRLA ENT: Oral Mucosa Clear, moist  Neck: no JVD,  Cardiovascular: S1 and S2 Present, no Murmur,  Respiratory: good respiratory effort, Bilateral  Air entry equal and Decreased, no Crackles, no wheezes Abdomen: Bowel Sound present, Soft and no tenderness,  Skin:  no rashes Extremities: no Pedal edema, no calf tenderness Neurologic: without any new focal findings Gait not checked due to patient safety concerns  Vitals:   04/22/22 1941 04/23/22 0500 04/23/22 0511 04/23/22 0733  BP: (!) 116/58  127/62 126/68  Pulse: (!) 51  (!) 54 (!) 50  Resp: Temp: 98.9 F (37.2 C)  98.1 F (36.7 C) 98 F (36.7 C)  TempSrc: Oral     SpO2: 99%  100% 97%  Weight:  78.4 kg    Height:        Intake/Output Summary (Last 24 hours) at 04/23/2022 1245 Last data filed at 04/23/2022 1020 Gross per 24 hour  Intake 120 ml  Output --  Net 120 ml    Filed Weights   04/20/22 0500 04/22/22 0500 04/23/22 0500  Weight: 78.9 kg 79.1 kg 78.4 kg    Data Reviewed: I have personally reviewed and interpreted daily labs, tele strips, imagings as discussed above. I reviewed all nursing notes, pharmacy notes, vitals, pertinent old records I have discussed plan of care as described above with RN and patient/family.  CBC: Recent Labs  Lab 04/16/22 1809 04/17/22 0409 04/18/22 0308 04/19/22 0640 04/20/22 0450 04/21/22 0353 04/22/22 0509  WBC 9.0   < > 5.6 6.3 5.5 6.5 6.3  NEUTROABS 6.5  --   --   --   --   --   --   HGB 12.0   < > 9.9* 9.8* 9.5* 9.8* 9.9*  HCT 39.5   < > 32.1* 31.4* 30.4* 31.3* 32.1*  MCV 82.8   < > 82.1 81.3 81.9 81.7 82.3  PLT 363   < > 235 251 248 253 246   < > = values in this interval not displayed.   Basic Metabolic Panel: Recent Labs  Lab 04/17/22 0409 04/18/22 0308 04/19/22 0640 04/20/22 0450 04/21/22 0353 04/22/22 0509  NA 140 135 137 138 138 138  K 3.6 3.5 3.1* 3.6 3.5 3.5  CL 108 102 105 108 104 100  CO2 GLUCOSE 95 87 116* 94 109* 105*  BUN 15 10 6* 5* 8 7*  CREATININE 0.68 0.64 0.70 0.66 0.68 0.66  CALCIUM 8.9 8.6* 8.4* 8.7* 8.4* 8.6*  MG 2.0 1.8 1.9 1.8  --   --   PHOS 2.6 2.9 2.8 3.0  --   --     Studies: No results found.  Scheduled Meds:  aspirin EC  81 mg Oral Daily   DULoxetine  30  mg Oral Daily   heparin  5,000 Units Subcutaneous Q8H   levothyroxine  75 mcg Oral Q0600   lisinopril  10 mg Oral BID   melatonin  5 mg Oral QHS   memantine  10 mg Oral BID   oxybutynin  5 mg Oral BID   pantoprazole  40 mg Oral BID   rivastigmine  4.6 mg Transdermal Daily   rosuvastatin  10 mg Oral QHS   sodium chloride flush  3 mL Intravenous Q12H   Continuous Infusions:   PRN Meds: acetaminophen **OR** acetaminophen, bisacodyl, QUEtiapine  Time spent: 35 minutes  Author: Gillis Santa. MD [facility] 04/23/2022 12:45 PM  To reach On-call, see care teams to locate the attending and reach out to them via [email or web address]. If 7PM-7AM, please contact night-coverage If you still have difficulty reaching  the attending provider, please page the [facility] (Director on Call) for [facility] on amion for assistance.

## 2022-04-23 NOTE — Care Management Important Message (Signed)
Important Message  Patient Details  Name: Belinda Gonzalez MRN: [id number] Date of Birth: 02-Apr-1934   Medicare Important Message Given:  Other (see comment)  Patient tis o discharge home with Hospice on Thursday. Out of respect for the patient and family no Important Message from [facility] given.   [name] A [name] 04/23/2022, 1:38 PM

## 2022-04-24 DIAGNOSIS — R410 Disorientation, unspecified: Secondary | ICD-10-CM | POA: Diagnosis not present

## 2022-04-24 DIAGNOSIS — C8330 Diffuse large B-cell lymphoma, unspecified site: Secondary | ICD-10-CM

## 2022-04-24 MED ORDER — MELATONIN 5 MG PO TABS: 5.0000 mg | ORAL_TABLET | Freq: Every day | ORAL | 2 refills | Status: AC

## 2022-04-24 MED ORDER — QUETIAPINE FUMARATE 25 MG PO TABS: 25.0000 mg | ORAL_TABLET | Freq: Two times a day (BID) | ORAL | 2 refills | Status: AC | PRN

## 2022-04-24 MED ORDER — LISINOPRIL 5 MG PO TABS: 5.0000 mg | ORAL_TABLET | Freq: Every day | ORAL | Status: DC

## 2022-04-24 MED ORDER — LISINOPRIL 5 MG PO TABS: 5.0000 mg | ORAL_TABLET | Freq: Two times a day (BID) | ORAL | Status: DC

## 2022-04-24 MED ORDER — LISINOPRIL 5 MG PO TABS: 5.0000 mg | ORAL_TABLET | Freq: Two times a day (BID) | ORAL | 2 refills | Status: DC

## 2022-04-24 MED ORDER — ACETAMINOPHEN 325 MG PO TABS: 650.0000 mg | ORAL_TABLET | Freq: Three times a day (TID) | ORAL | Status: DC | PRN

## 2022-04-24 MED ORDER — LISINOPRIL 5 MG PO TABS: 5.0000 mg | ORAL_TABLET | Freq: Every day | ORAL | 2 refills | Status: AC

## 2022-04-24 NOTE — TOC Transition Note (Addendum)
Transition of Care [facility]) - CM/SW Discharge Note   Patient Details  Name: Belinda Gonzalez MRN: [id number] Date of Birth: [birth year]  Transition of [facility] CM/SW Contact:  Allena Katz, LCSW Phone Number: 04/24/2022, 11:01 AM   Clinical Narrative:  Pt to discharge home with [facility] home hospice. DME to be delivered to patients house around 1pm. family would like [facility] to be arranged. Medical necessity to be printed to unit. No additional needs at this time.           Patient Goals and CMS Choice      Discharge Placement                         Discharge Plan and Services Additional resources added to the After Visit Summary for                                       Social Determinants of Health (SDOH) Interventions SDOH Screenings   Food Insecurity: No Food Insecurity (04/17/2022)  Housing: Low Risk  (04/17/2022)  Transportation Needs: No Transportation Needs (04/17/2022)  Utilities: Not At Risk (04/17/2022)  Tobacco Use: Medium Risk (04/17/2022)     Readmission Risk Interventions     No data to display

## 2022-04-24 NOTE — Progress Notes (Signed)
Reviewed Md order to discharge patient to home with hospice, reviewed discharge instructions,  home meds, prescriptions and follow appointments with [facility] and he verbalized.

## 2022-04-24 NOTE — Progress Notes (Signed)
IV removed by Hedy Jacob nurse tech

## 2022-04-24 NOTE — Discharge Summary (Signed)
[facility] Discharge Summary   Patient: Belinda BARTELSON Gonzalez:[id number]  PCP: Marisue Ivan, MD  Date of admission: 04/16/2022   Date of discharge: 04/24/2022 04/24/2022     Discharge Diagnoses:  Principal Problem:   Confusion and disorientation Active Problems:   Diffuse large B-cell lymphoma   Acquired hypothyroidism   DNR (do not resuscitate)   Essential hypertension   Lumbar stenosis   Cystitis   Retroperitoneal lymphadenopathy   Dementia without behavioral disturbance, psychotic disturbance, mood disturbance, or anxiety   Cervical lymphadenopathy   Admitted From: Home Disposition:  Home with Hospice services  Recommendations for Outpatient Follow-up:  Hospice at Home services Follow up LABS/TEST:  None   Diet recommendation: Regular diet  Activity: The patient is advised to gradually reintroduce usual activities, as tolerated  Discharge Condition: stable  Code Status: DNR   History of present illness: As per the H and P dictated on admission Hospital Course:  ALIVIYA Gonzalez is an 88 y.o. female with PMH of hypertension, paroxysmal A-fib, CVA, hypothyroid, COPD, Alzheimer's dementia as reviewed from EMR who presented at [facility] ED with complaining of generalized weakness, decreased oral intake and ambulation for past 3 days.  Patient had nausea vomiting and diarrhea for 3 days which resolved and after that patient developed generalized weakness possible dehydration and feeling very weak, unable to do ADLs what she was doing at baseline.  Currently patient is bedbound unable to ambulate.  No behavioral issues. ED workup: CT head and MRI brain shows chronic CVA, no acute findings. UA positive for UTI, CBC and CMP within normal range Patient was admitted under [facility] service for further management as below.   Assessment and Plan: # UTI, UA positive, urine culture grew Aerococcus, sensitivity not available.   S/p ceftriaxone 1 g IV daily x 5 days, completed course  and remained asymptomatic. # Hypokalemia, unknown cause, potassium repleted. Resolved  # Generalized weakness secondary to dehydration and recent acute gastroenteritis. S/p IVF for hydration, continue oral hydration CT head and MRI brain showed chronic CVA.  Patient does not have any focal deficits.  No acute findings on imaging. Continue supportive care, aspiration precaution and fall precautions. Ambulate with assistance. PT/OT Eval done.  Family requested palliative care, agreed with the hospice care at home and discharge planning today. # B-cell lymphoma, patient had incidental finding of retroperitoneal lymphadenopathy and cervical lymphadenopathy, biopsy was done by IR on 415, report positive for B-cell lymphoma.  Patient was seen by oncologist, less likely a candidate for treatment due to dementia and advanced age.  Family agreed, palliative care was consulted and patient was discharged with hospice services at home. # HTN, proximal A-fib, s/p Lisinopril 10 mg p.o. BID, patient had intermittent bradycardia, patient is not on any AV nodal blocker.  Blood pressure remained soft, decrease lisinopril 5 mg p.o. daily.  Monitor BP and titrate medications accordingly. # Chronic CVA, incidental finding on CT and MRI brain, Continue aspirin and statin # Depression and Alzheimer's dementia, continued Namenda and Exelon patch Continued Cymbalta # Hypothyroid, continued Synthroid # Sundowning and insomnia, started melatonin 5 mg nightly. Started Seroquel 25 mg po BID prn on discharge  Body mass index is 35.99 kg/m.  Nutrition Interventions:   Patient was seen by physical therapy, who recommended Home health On the day of the discharge the patient's vitals were stable, and no other acute medical condition were reported by patient. the patient was felt safe to be discharge at Home with Hospice services.  Consultants:  Oncologist and IR Procedures: Cervical LN biopsy  Discharge Exam: General: Appear in  no distress, no Rash; Oral Mucosa Clear, moist. Cardiovascular: S1 and S2 Present, no Murmur, Respiratory: normal respiratory effort, Bilateral Air entry present and no Crackles, no wheezes Abdomen: Bowel Sound present, Soft and no tenderness, no hernia Extremities: no Pedal edema, no calf tenderness Neurology: alert and oriented to time, place, and person affect appropriate.  Filed Weights   04/22/22 0500 04/23/22 0500 04/24/22 0605  Weight: 79.1 kg 78.4 kg 98.1 kg   Vitals:   04/24/22 0451 04/24/22 0814  BP: 106/62 104/62  Pulse: (!) 48 (!) 50  Resp: 18 16  Temp: 97.6 F (36.4 C) 97.7 F (36.5 C)  SpO2: 96% 100%    DISCHARGE MEDICATION: Allergies as of 04/24/2022       Reactions   Lipitor [atorvastatin]         Medication List     TAKE these medications    acetaminophen 325 MG tablet Commonly known as: TYLENOL Take 2 tablets (650 mg total) by mouth every 8 (eight) hours as needed for headache, fever, moderate pain or mild pain. What changed:  medication strength how much to take when to take this reasons to take this   alendronate 70 MG tablet Commonly known as: FOSAMAX Take 70 mg by mouth every 7 (seven) days.   aspirin EC 81 MG tablet Take 81 mg by mouth daily.   cyanocobalamin 1000 MCG tablet Commonly known as: VITAMIN B12 Take 1,000 mcg by mouth daily.   DULoxetine 30 MG capsule Commonly known as: CYMBALTA Take 30 mg by mouth daily.   Fish Oil 1000 MG Caps Take 1,000 mg by mouth 3 (three) times daily.   levothyroxine 75 MCG tablet Commonly known as: SYNTHROID Take 75 mcg by mouth daily before breakfast.   lisinopril 5 MG tablet Commonly known as: ZESTRIL Take 1 tablet (5 mg total) by mouth daily. Skip the dose if Systolic BP <140 mmHg What changed:  medication strength how much to take when to take this additional instructions   melatonin 5 MG Tabs Take 1 tablet (5 mg total) by mouth at bedtime.   memantine 10 MG tablet Commonly  known as: NAMENDA Take 10 mg by mouth 2 (two) times daily.   oxybutynin 5 MG tablet Commonly known as: DITROPAN Take 5 mg by mouth 2 (two) times daily.   QUEtiapine 25 MG tablet Commonly known as: SEROQUEL Take 1 tablet (25 mg total) by mouth 2 (two) times daily as needed (Sundowning, agitation).   rivastigmine 4.6 mg/24hr Commonly known as: EXELON Place 4.6 mg onto the skin daily.   rosuvastatin 10 MG tablet Commonly known as: CRESTOR Take 10 mg by mouth at bedtime.   Vitamin D3 1.25 MG (50000 UT) Tabs Take 50,000 Units by mouth every 7 (seven) days.       Allergies  Allergen Reactions   Lipitor [Atorvastatin]    Discharge Instructions     Call MD for:  difficulty breathing, headache or visual disturbances   Complete by: As directed    Call MD for:  persistant nausea and vomiting   Complete by: As directed    Call MD for:  severe uncontrolled pain   Complete by: As directed    Diet - low sodium heart healthy   Complete by: As directed    Discharge instructions   Complete by: As directed    Follow hospice care at home   Increase activity slowly   Complete  by: As directed        The results of significant diagnostics from this hospitalization (including imaging, microbiology, ancillary and laboratory) are listed below for reference.    Significant Diagnostic Studies: [place] CORE BIOPSY (LYMPH NODES)  Result Date: 04/21/2022 INDICATION: Cervical lymphadenopathy EXAM: Ultrasound-guided core needle biopsy of left cervical lymph node MEDICATIONS: None. ANESTHESIA/SEDATION: Local analgesia COMPLICATIONS: None immediate. PROCEDURE: Informed written consent was obtained from the patient after a thorough discussion of the procedural risks, benefits and alternatives. All questions were addressed. Maximal Sterile Barrier Technique was utilized including caps, mask, sterile gowns, sterile gloves, sterile drape, hand hygiene and skin antiseptic. A timeout was performed prior to  the initiation of the procedure. The patient was placed supine on the exam table. Ultrasound of the left neck demonstrated multiple enlarged cervical lymph nodes. Skin entry site was marked, and the overlying skin was prepped draped in the standard sterile fashion. Local analgesia was obtained with 1% lidocaine. Using ultrasound guidance, core needle biopsy was performed of the enlarged left cervical lymph node using an 18 gauge core biopsy device x6 total passes. Specimens were submitted in saline to pathology for further handling. Limited postprocedure imaging demonstrated no hematoma. A clean dressing was placed after manual hemostasis. The patient tolerated the procedure well without immediate complication. IMPRESSION: Successful ultrasound-guided core needle biopsy of enlarged left cervical lymph node. Electronically Signed   By: Olive Bass M.D.   On: 04/21/2022 12:29   CT CHEST W CONTRAST  Result Date: 04/17/2022 CLINICAL DATA:  Chest staging, retroperitoneal lymphadenopathy * Tracking Code: BO * EXAM: CT CHEST WITH CONTRAST TECHNIQUE: Multidetector CT imaging of the chest was performed during intravenous contrast administration. RADIATION DOSE REDUCTION: This exam was performed according to the departmental dose-optimization program which includes automated exposure control, adjustment of the mA and/or kV according to patient size and/or use of iterative reconstruction technique. CONTRAST:  75mL OMNIPAQUE IOHEXOL 350 MG/ML SOLN COMPARISON:  CT abdomen pelvis, 04/16/2022 FINDINGS: Cardiovascular: Aortic atherosclerosis. Cardiomegaly. Left coronary artery calcifications. No pericardial effusion. Mediastinum/Nodes: Enlarged lower left cervical lymph nodes up to 3.3 x 1.6 cm (series 2, image 11) small hiatal hernia. Thyroid gland, trachea, and esophagus demonstrate no significant findings. Lungs/Pleura: Dependent bibasilar scarring and partial atelectasis. No pleural effusion or pneumothorax. Upper  Abdomen: No acute abnormality. Upper abdominal lymphadenopathy as reported by prior CT. Musculoskeletal: No chest wall abnormality. No acute osseous findings. IMPRESSION: 1. Enlarged lower left cervical lymph nodes up to 3.3 x 1.6 cm. Upper abdominal lymphadenopathy as reported by prior CT. Findings remain consistent with lymphoma or nodal metastatic disease. 2. No candidate primary lesion identified in the chest. 3. Cardiomegaly and coronary artery disease. Aortic Atherosclerosis (ICD10-I70.0). Electronically Signed   By: Jearld Lesch M.D.   On: 04/17/2022 17:59   MR BRAIN WO CONTRAST  Result Date: 04/17/2022 CLINICAL DATA:  Initial evaluation for suspected UTI. No other relevant history provided. EXAM: MRI HEAD WITHOUT CONTRAST TECHNIQUE: Multiplanar, multiecho pulse sequences of the brain and surrounding structures were obtained without intravenous contrast. COMPARISON:  CT from earlier the same day. FINDINGS: Brain: Diffuse prominence of the CSF containing spaces compatible with generalized cerebral atrophy, moderately advanced in nature. Patchy and confluent T2/FLAIR hyperintensity involving the periventricular deep white matter both cerebral hemispheres as well as the pons, consistent with chronic small vessel ischemic disease, moderate in nature. Small remote right cerebellar infarct. Additional remote lacunar infarct at the left thalamic capsular region. No evidence for acute or subacute ischemia. Gray-white matter differentiation  maintained. No erosive chronic cortical infarction. No acute or chronic intracranial blood products. No mass lesion, midline shift or mass effect no hydrocephalus or extra-axial fluid collection. Pituitary gland and suprasellar region within normal limits. Vascular: Major intracranial vascular flow voids are maintained. Skull and upper cervical spine: Craniocervical junction within normal limits. Bone marrow signal intensity normal. Scalp soft tissues demonstrate no acute  finding. Sinuses/Orbits: Prior bilateral ocular lens replacement. Paranasal sinuses are largely clear. No mastoid effusion. Other: None. IMPRESSION: 1. No acute intracranial abnormality. 2. Moderately advanced cerebral atrophy with chronic small vessel ischemic disease, with small remote infarcts involving the right cerebellum and left thalamocapsular region. Electronically Signed   By: Rise Mu M.D.   On: 04/17/2022 01:02   CT ABDOMEN PELVIS W CONTRAST  Result Date: 04/16/2022 CLINICAL DATA:  Left lower quadrant abdominal pain * Tracking Code: BO * EXAM: CT ABDOMEN AND PELVIS WITH CONTRAST TECHNIQUE: Multidetector CT imaging of the abdomen and pelvis was performed using the standard protocol following bolus administration of intravenous contrast. RADIATION DOSE REDUCTION: This exam was performed according to the departmental dose-optimization program which includes automated exposure control, adjustment of the mA and/or kV according to patient size and/or use of iterative reconstruction technique. CONTRAST:  OMNIPAQUE IOHEXOL 300 MG/ML  SOLN COMPARISON:  11/13/2006 FINDINGS: Lower chest: No acute abnormality.  Small hiatal hernia. Hepatobiliary: No solid liver abnormality is seen. No gallstones, gallbladder wall thickening, or biliary dilatation. Pancreas: Unremarkable. No pancreatic ductal dilatation or surrounding inflammatory changes. Spleen: Normal in size without significant abnormality. Adrenals/Urinary Tract: Adrenal glands are unremarkable. Kidneys are normal, without renal calculi, solid lesion, or hydronephrosis. Bladder is unremarkable. Stomach/Bowel: Stomach is within normal limits. Appendix appears normal. No evidence of bowel wall thickening, distention, or inflammatory changes. Descending and sigmoid diverticulosis. Vascular/Lymphatic: Aortic atherosclerosis. Markedly enlarged left retroperitoneal lymph nodes measuring up to 3.6 x 2.8 cm (series, image 36). Reproductive:  Calcified uterine fibroids. Other: No abdominal wall hernia or abnormality. No ascites. Musculoskeletal: No acute or significant osseous findings. IMPRESSION: 1. Markedly enlarged left retroperitoneal lymph nodes measuring up to 3.6 x 2.8 cm, highly suspicious for lymphoma or nodal metastatic disease. No candidate primary mass identified in the abdomen or pelvis. 2. Descending and sigmoid diverticulosis without evidence of acute diverticulitis. 3. Calcified uterine fibroids. Aortic Atherosclerosis (ICD10-I70.0). Electronically Signed   By: Jearld Lesch M.D.   On: 04/16/2022 19:46   CT Head Wo Contrast  Result Date: 04/16/2022 CLINICAL DATA:  Altered mental status, nausea and vomiting. History of dementia. EXAM: CT HEAD WITHOUT CONTRAST TECHNIQUE: Contiguous axial images were obtained from the base of the skull through the vertex without intravenous contrast. RADIATION DOSE REDUCTION: This exam was performed according to the departmental dose-optimization program which includes automated exposure control, adjustment of the mA and/or kV according to patient size and/or use of iterative reconstruction technique. COMPARISON:  Head CT 03/01/2012. FINDINGS: Brain: No evidence of acute infarction, hemorrhage, hydrocephalus, extra-axial collection or mass lesion/mass effect. There is mild diffuse atrophy and moderate periventricular and deep white matter hypodensity which likely represents chronic small vessel ischemic change. Findings have progressed compared to the prior study. There is an old lacunar infarct in the left basal ganglia which is new from prior exam. There is a small old infarct in the right cerebellum which is new from prior exam. Vascular: Atherosclerotic calcifications are present within the cavernous internal carotid arteries. Skull: Normal. Negative for fracture or focal lesion. Sinuses/Orbits: No acute finding. Other: None. IMPRESSION: 1. No  acute intracranial abnormality. 2. Mild diffuse atrophy  and moderate chronic small vessel ischemic change, progressed compared to prior exam. 3. Old lacunar infarcts in the left basal ganglia and right cerebellum, new from prior exam. Electronically Signed   By: Darliss Cheney M.D.   On: 04/16/2022 19:37    Microbiology: Recent Results (from the past 240 hour(s))  Urine Culture (for pregnant, neutropenic or urologic patients or patients with an indwelling urinary catheter)     Status: Abnormal   Collection Time: 04/16/22  6:23 PM   Specimen: Urine, Clean Catch  Result Value Ref Range Status   Specimen Description   Final    URINE, CLEAN CATCH Performed at [facility], [address], [place], [place] [address]    Special Requests   Final    NONE Performed at [facility], [address] [place], [place] [address]    Culture (A)  Final    >=100,000 COLONIES/mL AEROCOCCUS URINAE Standardized susceptibility testing for this organism is not available. Performed at [facility], [address], [place], [place] [address]    Report Status 04/20/2022 FINAL  Final     Labs: CBC: Recent Labs  Lab 04/18/22 0308 04/19/22 0640 04/20/22 0450 04/21/22 0353 04/22/22 0509  WBC 5.6 6.3 5.5 6.5 6.3  HGB 9.9* 9.8* 9.5* 9.8* 9.9*  HCT 32.1* 31.4* 30.4* 31.3* 32.1*  MCV 82.1 81.3 81.9 81.7 82.3  PLT 235 251 248 253 246   Basic Metabolic Panel: Recent Labs  Lab 04/18/22 0308 04/19/22 0640 04/20/22 0450 04/21/22 0353 04/22/22 0509  NA 135 137 138 138 138  K 3.5 3.1* 3.6 3.5 3.5  CL 102 105 108 104 100  CO2 GLUCOSE 87 116* 94 109* 105*  BUN 10 6* 5* 8 7*  CREATININE 0.64 0.70 0.66 0.68 0.66  CALCIUM 8.6* 8.4* 8.7* 8.4* 8.6*  MG 1.8 1.9 1.8  --   --   PHOS 2.9 2.8 3.0  --   --    Liver Function Tests: No results for input(s): "AST", "ALT", "ALKPHOS", "BILITOT", "PROT", "ALBUMIN" in the last 168 hours. No results for input(s): "LIPASE", "AMYLASE" in the last 168 hours. No results for input(s):  "AMMONIA" in the last 168 hours. Cardiac Enzymes: No results for input(s): "CKTOTAL", "CKMB", "CKMBINDEX", "TROPONINI" in the last 168 hours. BNP (last 3 results) No results for input(s): "BNP" in the last 8760 hours. CBG: No results for input(s): "GLUCAP" in the last 168 hours.  Time spent: 35 minutes  Signed:  Gillis Santa  [facility] 04/24/2022 04/24/2022 2:57 PM

## 2022-04-24 NOTE — Progress Notes (Signed)
Occupational Therapy Treatment Patient Details Name: Belinda Gonzalez MRN: [id number] DOB: 09-08-1934 Today's Date: 04/24/2022   History of present illness Pt is an 88 y.o. female brought by bedside for confusion more so than her usual baseline dementia.  Patient also reported to have nausea vomiting diarrhea. PMH of COPD, depression, HTN, thyroid disease.   OT comments  Belinda Gonzalez is lethargic and largely non-communicative this AM. She required Max A for bed-level mobility, self-feeding, grooming. She is oriented to self only and is largely unable to follow one-step directions. Pt will be transferring home with hospice care. Today's session focused on guiding pt to be able to perform basic ADL with reduced level of assistance, to reduce caregiver burden and promote pt comfort and quality of life. Anticipate that pt will require Max-Total A with most needs; after set-up, may be able to perform self-feeding and grooming in sitting with a reduced level of assistance.    Recommendations for follow up therapy are one component of a multi-disciplinary discharge planning process, led by the attending physician.  Recommendations may be updated based on patient status, additional functional criteria and insurance authorization.    Assistance Recommended at Discharge Frequent or constant Supervision/Assistance  Patient can return home with the following  A lot of help with walking and/or transfers;A lot of help with bathing/dressing/bathroom;Assistance with feeding;Assistance with cooking/housework;Direct supervision/assist for medications management;Direct supervision/assist for financial management;Assist for transportation;Help with stairs or ramp for entrance   Equipment Recommendations       Recommendations for Other Services      Precautions / Restrictions Precautions Precautions: Fall Restrictions Weight Bearing Restrictions: No       Mobility Bed Mobility Overal bed mobility: Needs  Assistance Bed Mobility: Supine to Sit, Sit to Supine     Supine to sit: Max assist Sit to supine: Max assist        Transfers                   General transfer comment: NT     Balance Overall balance assessment: Needs assistance   Sitting balance-Leahy Scale: Poor                                     ADL either performed or assessed with clinical judgement   ADL Overall ADL's : Needs assistance/impaired Eating/Feeding: Maximal assistance;Sitting   Grooming: Maximal assistance;Bed level                                      Extremity/Trunk Assessment Upper Extremity Assessment Upper Extremity Assessment: Generalized weakness   Lower Extremity Assessment Lower Extremity Assessment: Generalized weakness        Vision       Perception     Praxis      Cognition Arousal/Alertness: Lethargic Behavior During Therapy: Flat affect Overall Cognitive Status: History of cognitive impairments - at baseline                                 General Comments: Pt lethargic, largely non-communicative during session        Exercises Other Exercises Other Exercises: cognitive re-orientation, dressing, self-feeding    Shoulder Instructions       General Comments      Pertinent Vitals/ Pain  Pain Assessment Breathing: normal Negative Vocalization: none Facial Expression: smiling or inexpressive Body Language: relaxed Consolability: no need to console PAINAD Score: 0  Home Living                                          Prior Functioning/Environment              Frequency  Min 1X/week        Progress Toward Goals  OT Goals(current goals can now be found in the care plan section)  Progress towards OT goals: Progressing toward goals (goal modified to home with hospice care)  Acute Rehab OT Goals OT Goal Formulation: With family Time For Goal Achievement:  05/01/22 Potential to Achieve Goals: Fair  Plan Frequency remains appropriate;Discharge plan needs to be updated    Co-evaluation                 AM-PAC OT "6 Clicks" Daily Activity     Outcome Measure   Help from another person eating meals?: A Lot Help from another person taking care of personal grooming?: A Lot Help from another person toileting, which includes using toliet, bedpan, or urinal?: A Lot Help from another person bathing (including washing, rinsing, drying)?: A Lot Help from another person to put on and taking off regular upper body clothing?: A Lot Help from another person to put on and taking off regular lower body clothing?: A Lot 6 Click Score: 12    End of Session    OT Visit Diagnosis: Other abnormalities of gait and mobility (R26.89);Muscle weakness (generalized) (M62.81)   Activity Tolerance Patient limited by lethargy;Patient tolerated treatment well   Patient Left in bed;with call bell/phone within reach;with bed alarm set   Nurse Communication          Time: 3016-0109 OT Time Calculation (min): 11 min  Charges: OT General Charges $OT Visit: 1 Visit Belinda Craver, PhD, MS, OTR/L 04/24/22, 9:57 AM

## 2022-05-01 ENCOUNTER — Inpatient Hospital Stay: Payer: Medicare Other | Attending: Internal Medicine | Admitting: Internal Medicine

## 2022-05-01 ENCOUNTER — Encounter: Payer: Self-pay | Admitting: Internal Medicine

## 2022-05-01 DIAGNOSIS — C8338 Diffuse large B-cell lymphoma, lymph nodes of multiple sites: Secondary | ICD-10-CM | POA: Diagnosis not present

## 2022-05-01 DIAGNOSIS — F03B0 Unspecified dementia, moderate, without behavioral disturbance, psychotic disturbance, mood disturbance, and anxiety: Secondary | ICD-10-CM | POA: Diagnosis not present

## 2022-05-01 NOTE — Progress Notes (Signed)
I spoke with the caregiver [name].

## 2022-05-01 NOTE — Progress Notes (Signed)
[facility] CONSULT NOTE  Patient Care Team: Marisue Ivan, MD as PCP - General (Family Medicine)   CANCER STAGING   Cancer Staging  Diffuse large B-cell lymphoma Staging form: Hodgkin and Non-Hodgkin Lymphoma, AJCC 8th Edition - Clinical: Stage III (Diffuse large B-cell lymphoma) - Signed by Michaelyn Barter, MD on 05/01/2022 Stage prefix: Initial diagnosis   ASSESSMENT & PLAN:  Belinda Gonzalez 88 y.o. female with pmh of COPD, depression, hypertension tension and advanced dementia presented to [facility] ED on 04/16/2022 with concerns of diarrhea, nausea, generalized weakness for the past few days.  Had incidental finding of retroperitoneal lymphadenopathy.   # Diffuse large B-cell lymphoma, at least stage III - CT abdomen pelvis done for diarrhea showed incidental finding of markedly enlarged left retroperitoneal lymph node measuring up to 3.6 x 2.8 cm.  Highly suspicious for lymphoma or nodal metastatic disease.  No primary mass identified in the abdomen or pelvis. CT chest showed 3.3 cm left lower neck lymph node.  - underwent left cervical lymph node biopsy on 04/21/2022.  Pathology showed diffuse large B-cell lymphoma, NOS, CD30 positive.  Ki-67 significantly increased.  Presence of positivity for BCL6 and MUM1 may suggest ABC subtype.  FISH pending.  -I discussed with the patient's husband and caregiver/ex daughter-in-law about the pathology report showing diffuse large B-cell lymphoma which tends to be aggressive in nature.  It is usually treated with R-CHOP chemotherapy regimen.  Considering patient's advanced age and advanced dementia she will be a poor candidate for any systemic treatment.  The risks of the cytotoxic agents outweighs any benefit.  She was discharged from the hospital on hospice which I am agreeable to.  Patient's family and children were very understandable and agrees to the treatment plan.  They had multiple questions which were all answered to their  satisfaction.  They know they can reach out to me if any further questions arises.    # Advanced dementia # Chronic stroke -on aspirin and statin # A-fib # Hypertension # Hypothyroidism  No orders of the defined types were placed in this encounter.  RTC as needed  The total time spent in the appointment was 30 minutes encounter with patients including review of chart and various tests results, discussions about plan of care and coordination of care plan   All questions were answered. The patient knows to call the clinic with any problems, questions or concerns. No barriers to learning was detected.  Michaelyn Barter, MD 4/25/20243:23 PM   HISTORY OF PRESENTING ILLNESS:  Belinda Gonzalez 88 y.o. female with pmh of COPD, depression, hypertension tension and advanced dementia presented to [facility] ED on 04/16/2022 with concerns of diarrhea, nausea, generalized weakness for the past few days.  Had incidental finding of retroperitoneal lymphadenopathy.   CT abdomen pelvis done for diarrhea showed incidental finding of markedly enlarged left retroperitoneal lymph node measuring up to 3.6 x 2.8 cm.  Highly suspicious for lymphoma or nodal metastatic disease.  No primary mass identified in the abdomen or pelvis. CT chest showed 3.3 cm left lower neck lymph node.  Patient was underwent left cervical lymph node biopsy on 04/21/2022.  Pathology showed diffuse large B-cell lymphoma, NOS, CD30 positive.  Ki-67 significantly increased.  Presence of positivity for BCL6 and MOM1 may suggest ABC subtype.  Interval history- I connected with the patient, husband and caregiver/as daughter-in-law via a video visit.  Patient has advanced dementia and was unable to answer any questions.  But she seemed comfortable was  sitting in the chair. Family reported that she is has good days and bad days.  Like yesterday she was able to get into bathroom by herself.  Denies any pain.  I have reviewed her chart and materials  related to her cancer extensively and collaborated history with the patient. Summary of oncologic history is as follows: Oncology History   No history exists.    MEDICAL HISTORY:  Past Medical History:  Diagnosis Date   COPD (chronic obstructive pulmonary disease)    Dementia    Depression    Hypertension    Thyroid disease     SURGICAL HISTORY: Past Surgical History:  Procedure Laterality Date   CATARACT EXTRACTION     CESAREAN SECTION     60 years ago   LUMBAR DISC SURGERY     20 years ago   ROTATOR CUFF REPAIR Right    10-15 years ago    SOCIAL HISTORY: Social History   Socioeconomic History   Marital status: Married    Spouse name: Not on file   Number of children: Not on file   Years of education: Not on file   Highest education level: Not on file  Occupational History   Not on file  Tobacco Use   Smoking status: Former    Types: Cigarettes    Quit date: 01/07/1999    Years since quitting: 23.3   Smokeless tobacco: Never  Vaping Use   Vaping Use: Never used  Substance and Sexual Activity   Alcohol use: Not Currently   Drug use: Not Currently   Sexual activity: Not on file  Other Topics Concern   Not on file  Social History Narrative   Not on file   Social Determinants of Health   Financial Resource Strain: Not on file  Food Insecurity: No Food Insecurity (05/01/2022)   Hunger Vital Sign    Worried About Running Out of Food in the Last Year: Never true    Ran Out of Food in the Last Year: Never true  Transportation Needs: No Transportation Needs (05/01/2022)   PRAPARE - Administrator, Civil Service (Medical): No    Lack of Transportation (Non-Medical): No  Physical Activity: Not on file  Stress: Not on file  Social Connections: Not on file  Intimate Partner Violence: Not At Risk (04/17/2022)   Humiliation, Afraid, Rape, and Kick questionnaire    Fear of Current or Ex-Partner: No    Emotionally Abused: No    Physically Abused: No     Sexually Abused: No    FAMILY HISTORY: Family History  Problem Relation Age of Onset   Cancer Sister     ALLERGIES:  is allergic to lipitor [atorvastatin].  MEDICATIONS:  Current Outpatient Medications  Medication Sig Dispense Refill   acetaminophen (TYLENOL) 325 MG tablet Take 2 tablets (650 mg total) by mouth every 8 (eight) hours as needed for headache, fever, moderate pain or mild pain. (Patient taking differently: Take 500 mg by mouth every 8 (eight) hours as needed for headache, fever, moderate pain or mild pain.)     aspirin EC 81 MG tablet Take 81 mg by mouth daily.     DULoxetine (CYMBALTA) 30 MG capsule Take 30 mg by mouth daily.     levothyroxine (SYNTHROID, LEVOTHROID) 75 MCG tablet Take 75 mcg by mouth daily before breakfast.      lisinopril (ZESTRIL) 5 MG tablet Take 1 tablet (5 mg total) by mouth daily. Skip the dose if Systolic BP <140  mmHg 30 tablet 2   melatonin 5 MG TABS Take 1 tablet (5 mg total) by mouth at bedtime. 30 tablet 2   memantine (NAMENDA) 10 MG tablet Take 10 mg by mouth 2 (two) times daily.      oxybutynin (DITROPAN) 5 MG tablet Take 5 mg by mouth 2 (two) times daily.  1   rosuvastatin (CRESTOR) 10 MG tablet Take 10 mg by mouth at bedtime.      vitamin B-12 (CYANOCOBALAMIN) 1000 MCG tablet Take 1,000 mcg by mouth daily.     alendronate (FOSAMAX) 70 MG tablet Take 70 mg by mouth every 7 (seven) days. (Patient not taking: Reported on 05/01/2022)  1   Cholecalciferol (VITAMIN D3) 50000 units TABS Take 50,000 Units by mouth every 7 (seven) days. (Patient not taking: Reported on 05/01/2022)     Omega-3 Fatty Acids (FISH OIL) 1000 MG CAPS Take 1,000 mg by mouth 3 (three) times daily. (Patient not taking: Reported on 05/01/2022)     QUEtiapine (SEROQUEL) 25 MG tablet Take 1 tablet (25 mg total) by mouth 2 (two) times daily as needed (Sundowning, agitation). (Patient not taking: Reported on 05/01/2022) 30 tablet 2   rivastigmine (EXELON) 4.6 mg/24hr Place 4.6 mg onto  the skin daily.     No current facility-administered medications for this visit.    REVIEW OF SYSTEMS:   Pertinent information mentioned in HPI All other systems were reviewed with the patient and are negative.  PHYSICAL EXAMINATION: ECOG PERFORMANCE STATUS: 3 - Symptomatic, >50% confined to bed  There were no vitals filed for this visit. There were no vitals filed for this visit.  GENERAL:alert, no distress and comfortable SKIN: skin color, texture, turgor are normal, no rashes or significant lesions EYES: normal, conjunctiva are pink and non-injected, sclera clear OROPHARYNX:no exudate, no erythema and lips, buccal mucosa, and tongue normal  NECK: supple, thyroid normal size, non-tender, without nodularity LYMPH:  no palpable lymphadenopathy in the cervical, axillary or inguinal LUNGS: clear to auscultation and percussion with normal breathing effort HEART: regular rate & rhythm and no murmurs and no lower extremity edema ABDOMEN:abdomen soft, non-tender and normal bowel sounds Musculoskeletal:no cyanosis of digits and no clubbing  PSYCH: alert & oriented x 3 with fluent speech NEURO: no focal motor/sensory deficits  LABORATORY DATA:  I have reviewed the data as listed Lab Results  Component Value Date   WBC 6.3 04/22/2022   HGB 9.9 (L) 04/22/2022   HCT 32.1 (L) 04/22/2022   MCV 82.3 04/22/2022   PLT 246 04/22/2022   Recent Labs    04/16/22 1809 04/17/22 0409 04/18/22 0308 04/20/22 0450 04/21/22 0353 04/22/22 0509  NA 138 140   < > 138 138 138  K 3.8 3.6   < > 3.6 3.5 3.5  CL 105 108   < > 108 104 100  CO2 23 24   < > 24 26 30   GLUCOSE 117* 95   < > 94 109* 105*  BUN 21 15   < > 5* 8 7*  CREATININE 0.89 0.68   < > 0.66 0.68 0.66  CALCIUM 9.6 8.9   < > 8.7* 8.4* 8.6*  GFRNONAA >60 >60   < > >60 >60 >60  PROT 7.6 6.5  --   --   --   --   ALBUMIN 3.3* 2.8*  --   --   --   --   AST 14* 13*  --   --   --   --  ALT 10 10  --   --   --   --   ALKPHOS 79 72  --    --   --   --   BILITOT 1.1 0.9  --   --   --   --    < > = values in this interval not displayed.    RADIOGRAPHIC STUDIES: I have personally reviewed the radiological images as listed and agreed with the findings in the report. [place] CORE BIOPSY (LYMPH NODES)  Result Date: 04/21/2022 INDICATION: Cervical lymphadenopathy EXAM: Ultrasound-guided core needle biopsy of left cervical lymph node MEDICATIONS: None. ANESTHESIA/SEDATION: Local analgesia COMPLICATIONS: None immediate. PROCEDURE: Informed written consent was obtained from the patient after a thorough discussion of the procedural risks, benefits and alternatives. All questions were addressed. Maximal Sterile Barrier Technique was utilized including caps, mask, sterile gowns, sterile gloves, sterile drape, hand hygiene and skin antiseptic. A timeout was performed prior to the initiation of the procedure. The patient was placed supine on the exam table. Ultrasound of the left neck demonstrated multiple enlarged cervical lymph nodes. Skin entry site was marked, and the overlying skin was prepped draped in the standard sterile fashion. Local analgesia was obtained with 1% lidocaine. Using ultrasound guidance, core needle biopsy was performed of the enlarged left cervical lymph node using an 18 gauge core biopsy device x6 total passes. Specimens were submitted in saline to pathology for further handling. Limited postprocedure imaging demonstrated no hematoma. A clean dressing was placed after manual hemostasis. The patient tolerated the procedure well without immediate complication. IMPRESSION: Successful ultrasound-guided core needle biopsy of enlarged left cervical lymph node. Electronically Signed   By: Olive Bass M.D.   On: 04/21/2022 12:29   CT CHEST W CONTRAST  Result Date: 04/17/2022 CLINICAL DATA:  Chest staging, retroperitoneal lymphadenopathy * Tracking Code: BO * EXAM: CT CHEST WITH CONTRAST TECHNIQUE: Multidetector CT imaging of the chest  was performed during intravenous contrast administration. RADIATION DOSE REDUCTION: This exam was performed according to the departmental dose-optimization program which includes automated exposure control, adjustment of the mA and/or kV according to patient size and/or use of iterative reconstruction technique. CONTRAST:  75mL OMNIPAQUE IOHEXOL 350 MG/ML SOLN COMPARISON:  CT abdomen pelvis, 04/16/2022 FINDINGS: Cardiovascular: Aortic atherosclerosis. Cardiomegaly. Left coronary artery calcifications. No pericardial effusion. Mediastinum/Nodes: Enlarged lower left cervical lymph nodes up to 3.3 x 1.6 cm (series 2, image 11) small hiatal hernia. Thyroid gland, trachea, and esophagus demonstrate no significant findings. Lungs/Pleura: Dependent bibasilar scarring and partial atelectasis. No pleural effusion or pneumothorax. Upper Abdomen: No acute abnormality. Upper abdominal lymphadenopathy as reported by prior CT. Musculoskeletal: No chest wall abnormality. No acute osseous findings. IMPRESSION: 1. Enlarged lower left cervical lymph nodes up to 3.3 x 1.6 cm. Upper abdominal lymphadenopathy as reported by prior CT. Findings remain consistent with lymphoma or nodal metastatic disease. 2. No candidate primary lesion identified in the chest. 3. Cardiomegaly and coronary artery disease. Aortic Atherosclerosis (ICD10-I70.0). Electronically Signed   By: Jearld Lesch M.D.   On: 04/17/2022 17:59   MR BRAIN WO CONTRAST  Result Date: 04/17/2022 CLINICAL DATA:  Initial evaluation for suspected UTI. No other relevant history provided. EXAM: MRI HEAD WITHOUT CONTRAST TECHNIQUE: Multiplanar, multiecho pulse sequences of the brain and surrounding structures were obtained without intravenous contrast. COMPARISON:  CT from earlier the same day. FINDINGS: Brain: Diffuse prominence of the CSF containing spaces compatible with generalized cerebral atrophy, moderately advanced in nature. Patchy and confluent T2/FLAIR hyperintensity  involving the periventricular  deep white matter both cerebral hemispheres as well as the pons, consistent with chronic small vessel ischemic disease, moderate in nature. Small remote right cerebellar infarct. Additional remote lacunar infarct at the left thalamic capsular region. No evidence for acute or subacute ischemia. Gray-white matter differentiation maintained. No erosive chronic cortical infarction. No acute or chronic intracranial blood products. No mass lesion, midline shift or mass effect no hydrocephalus or extra-axial fluid collection. Pituitary gland and suprasellar region within normal limits. Vascular: Major intracranial vascular flow voids are maintained. Skull and upper cervical spine: Craniocervical junction within normal limits. Bone marrow signal intensity normal. Scalp soft tissues demonstrate no acute finding. Sinuses/Orbits: Prior bilateral ocular lens replacement. Paranasal sinuses are largely clear. No mastoid effusion. Other: None. IMPRESSION: 1. No acute intracranial abnormality. 2. Moderately advanced cerebral atrophy with chronic small vessel ischemic disease, with small remote infarcts involving the right cerebellum and left thalamocapsular region. Electronically Signed   By: Rise Mu M.D.   On: 04/17/2022 01:02   CT ABDOMEN PELVIS W CONTRAST  Result Date: 04/16/2022 CLINICAL DATA:  Left lower quadrant abdominal pain * Tracking Code: BO * EXAM: CT ABDOMEN AND PELVIS WITH CONTRAST TECHNIQUE: Multidetector CT imaging of the abdomen and pelvis was performed using the standard protocol following bolus administration of intravenous contrast. RADIATION DOSE REDUCTION: This exam was performed according to the departmental dose-optimization program which includes automated exposure control, adjustment of the mA and/or kV according to patient size and/or use of iterative reconstruction technique. CONTRAST:  OMNIPAQUE IOHEXOL 300 MG/ML  SOLN COMPARISON:  11/13/2006  FINDINGS: Lower chest: No acute abnormality.  Small hiatal hernia. Hepatobiliary: No solid liver abnormality is seen. No gallstones, gallbladder wall thickening, or biliary dilatation. Pancreas: Unremarkable. No pancreatic ductal dilatation or surrounding inflammatory changes. Spleen: Normal in size without significant abnormality. Adrenals/Urinary Tract: Adrenal glands are unremarkable. Kidneys are normal, without renal calculi, solid lesion, or hydronephrosis. Bladder is unremarkable. Stomach/Bowel: Stomach is within normal limits. Appendix appears normal. No evidence of bowel wall thickening, distention, or inflammatory changes. Descending and sigmoid diverticulosis. Vascular/Lymphatic: Aortic atherosclerosis. Markedly enlarged left retroperitoneal lymph nodes measuring up to 3.6 x 2.8 cm (series, image 36). Reproductive: Calcified uterine fibroids. Other: No abdominal wall hernia or abnormality. No ascites. Musculoskeletal: No acute or significant osseous findings. IMPRESSION: 1. Markedly enlarged left retroperitoneal lymph nodes measuring up to 3.6 x 2.8 cm, highly suspicious for lymphoma or nodal metastatic disease. No candidate primary mass identified in the abdomen or pelvis. 2. Descending and sigmoid diverticulosis without evidence of acute diverticulitis. 3. Calcified uterine fibroids. Aortic Atherosclerosis (ICD10-I70.0). Electronically Signed   By: Jearld Lesch M.D.   On: 04/16/2022 19:46   CT Head Wo Contrast  Result Date: 04/16/2022 CLINICAL DATA:  Altered mental status, nausea and vomiting. History of dementia. EXAM: CT HEAD WITHOUT CONTRAST TECHNIQUE: Contiguous axial images were obtained from the base of the skull through the vertex without intravenous contrast. RADIATION DOSE REDUCTION: This exam was performed according to the departmental dose-optimization program which includes automated exposure control, adjustment of the mA and/or kV according to patient size and/or use of iterative  reconstruction technique. COMPARISON:  Head CT 03/01/2012. FINDINGS: Brain: No evidence of acute infarction, hemorrhage, hydrocephalus, extra-axial collection or mass lesion/mass effect. There is mild diffuse atrophy and moderate periventricular and deep white matter hypodensity which likely represents chronic small vessel ischemic change. Findings have progressed compared to the prior study. There is an old lacunar infarct in the left basal ganglia which is new from  prior exam. There is a small old infarct in the right cerebellum which is new from prior exam. Vascular: Atherosclerotic calcifications are present within the cavernous internal carotid arteries. Skull: Normal. Negative for fracture or focal lesion. Sinuses/Orbits: No acute finding. Other: None. IMPRESSION: 1. No acute intracranial abnormality. 2. Mild diffuse atrophy and moderate chronic small vessel ischemic change, progressed compared to prior exam. 3. Old lacunar infarcts in the left basal ganglia and right cerebellum, new from prior exam. Electronically Signed   By: Darliss Cheney M.D.   On: 04/16/2022 19:37

## 2022-05-22 ENCOUNTER — Encounter: Payer: Self-pay | Admitting: Internal Medicine

## 2022-05-22 LAB — SURGICAL PATHOLOGY

## 2022-10-07 DEATH — deceased

## 2023-01-13 IMAGING — RF DG ESOPHAGUS
9 series · 14 of 24 positions shown · non-contrast
Comparison: Speech swallow evaluation June 19, 2017

INDICATION: Patient presents today with her caretaker and history is obtained
from both the patient and her caretaker. Patient has intermittent
episodes of choking on solids and liquids sometimes resulting in
vomiting. There has been no significant coughing episodes with
liquids.

EXAM:
ESOPHAGUS/BARIUM SWALLOW/TABLET STUDY
TECHNIQUE: Combined double and single contrast examination was performed using
effervescent crystals, thick barium liquid and thin barium liquid.
The patient was observed with fluoroscopy swallowing a 13 mm barium
sulphate tablet.
FLUOROSCOPY TIME:  Radiation Exposure Index (as provided by the
fluoroscopic device): 13.60 mGy

[Series 1: cp_standard · 0.18mm/px · 1 of 1 slices shown (1 of 9)]
[im 1/1]
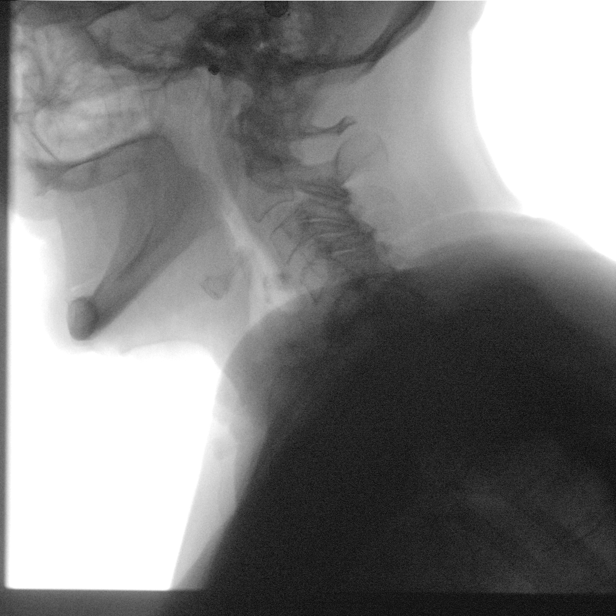

[Series 2: cp_standard · 0.18mm/px · 1 of 47 frames shown (2 of 9)]
[frame 40/47]
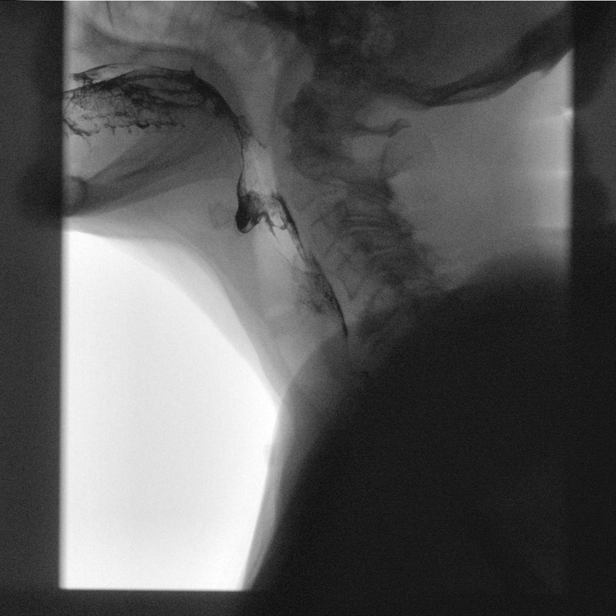

[Series 3: cp_standard · 0.18mm/px · 2 of 46 frames shown (3 of 9)]
[frame 24/46]
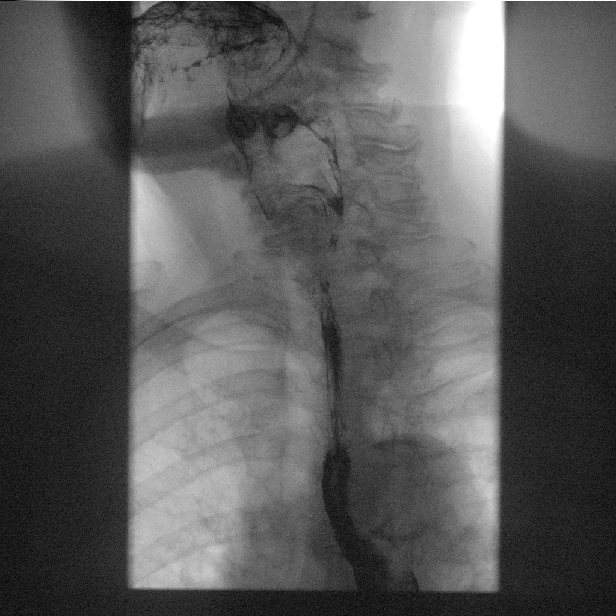
[frame 40/46]
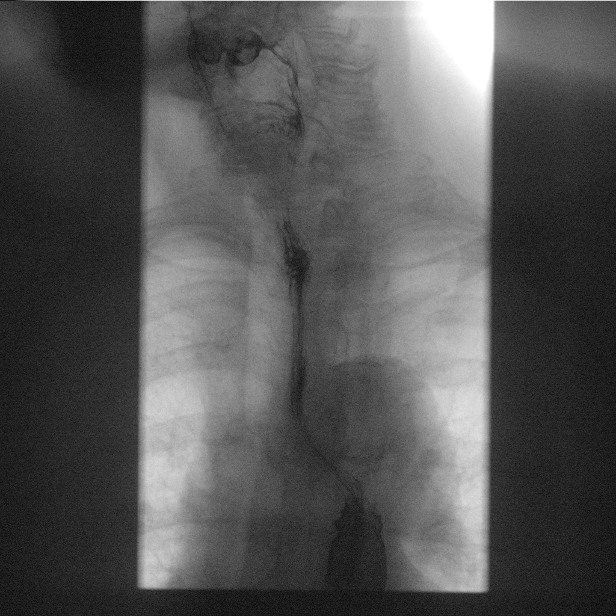

[Series 4: cp_standard · 0.28mm/px · 1 of 207 frames shown (4 of 9)]
[frame 104/207]
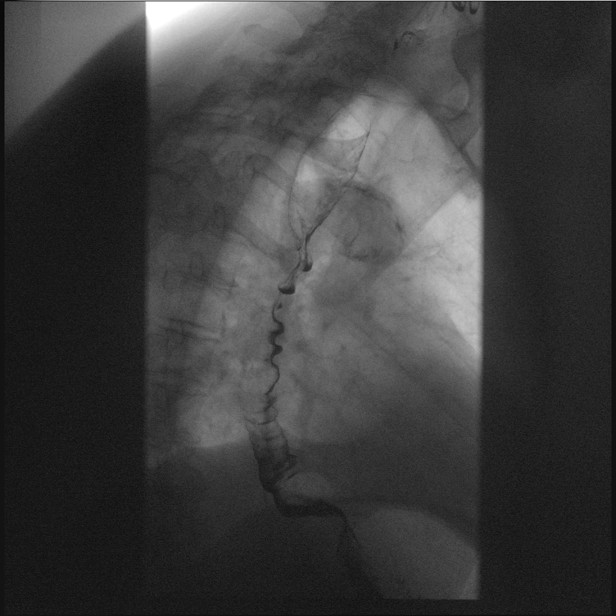

[Series 5: cp_standard · 0.28mm/px · 2 of 73 frames shown (5 of 9)]
[frame 3/73]
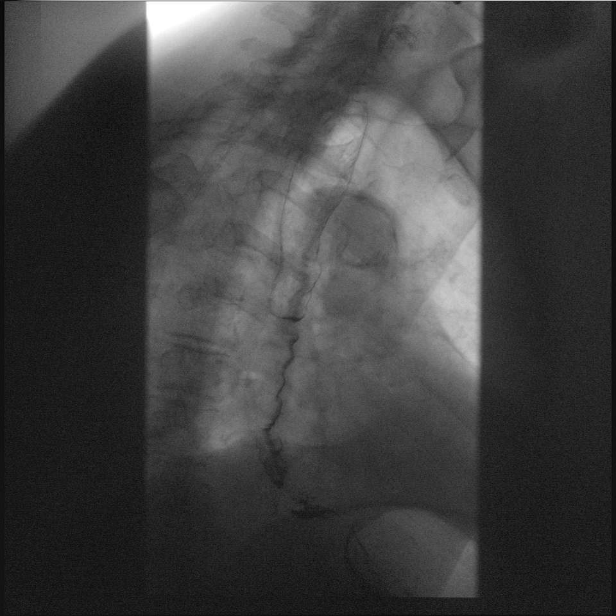
[frame 37/73]
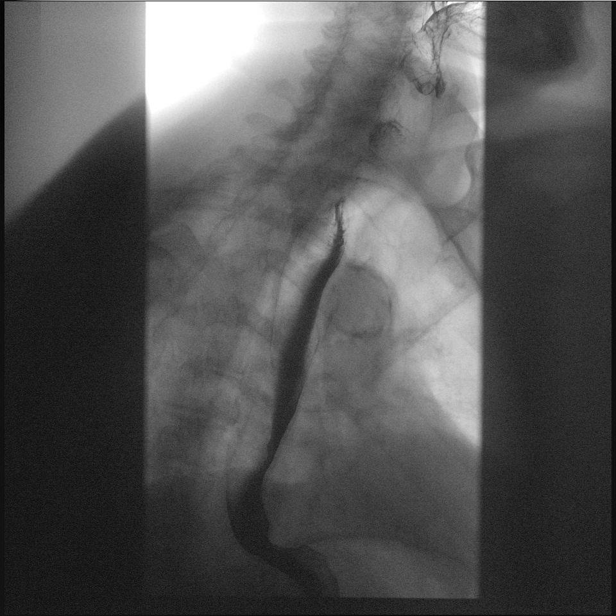

[Series 6: cp_standard · 0.30mm/px · 2 of 122 frames shown (6 of 9)]
[frame 19/122]
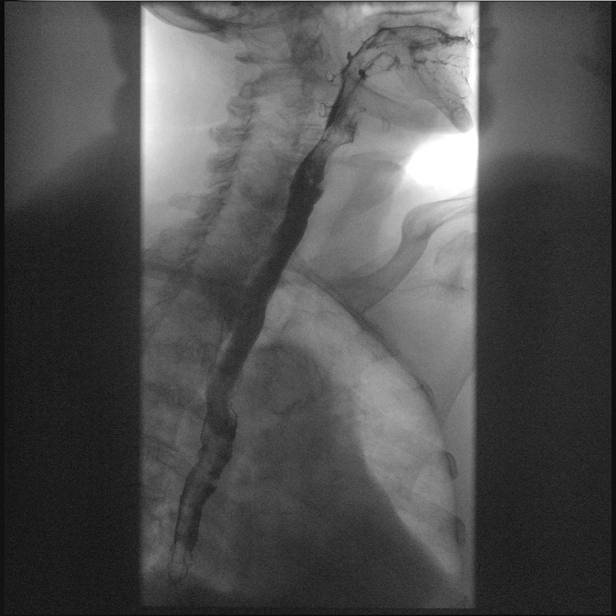
[frame 73/122]
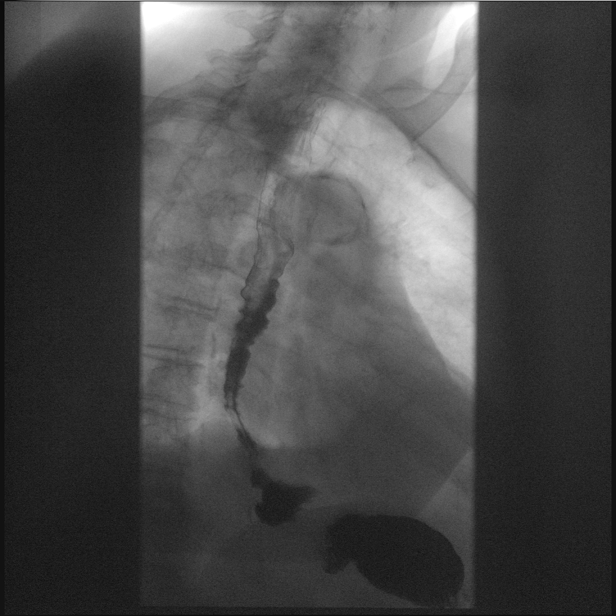

[Series 7: cp_standard · 0.29mm/px · 1 of 210 frames shown (7 of 9)]
[frame 106/210]
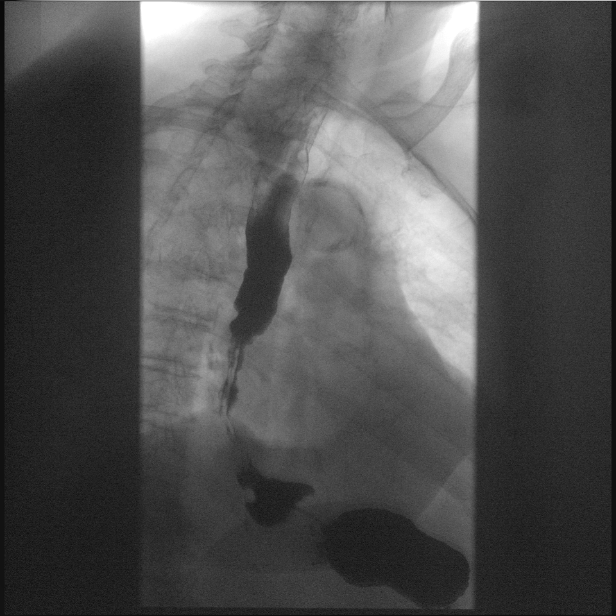

[Series 8: cp_standard · 0.29mm/px · 2 of 57 frames shown (8 of 9)]
[frame 9/57]
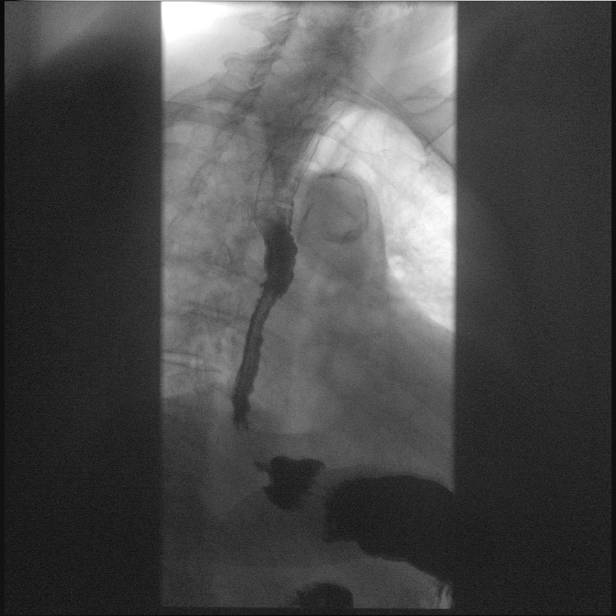
[frame 19/57]
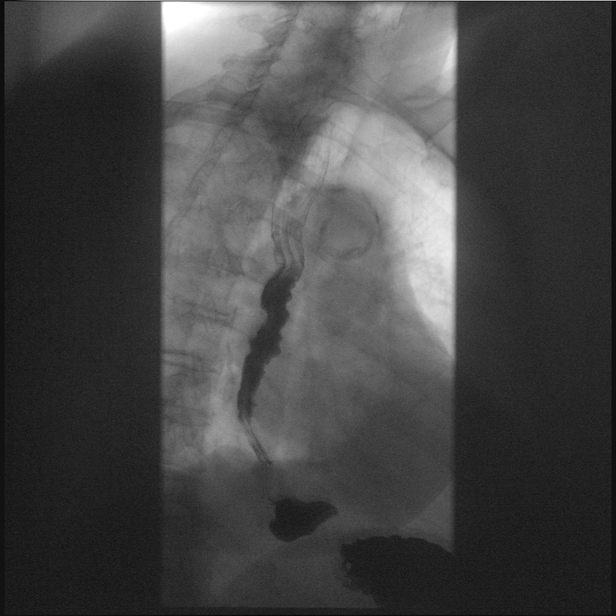

[Series 9: cp_standard · 0.27mm/px · 2 of 100 frames shown (9 of 9)]
[frame 16/100]
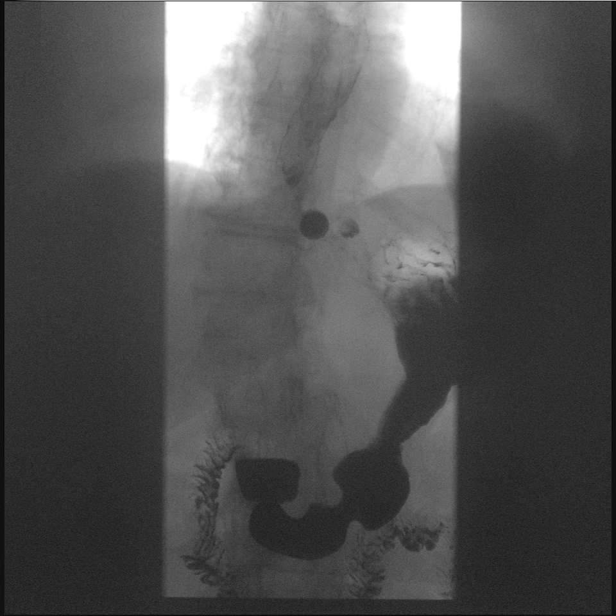
[frame 86/100]
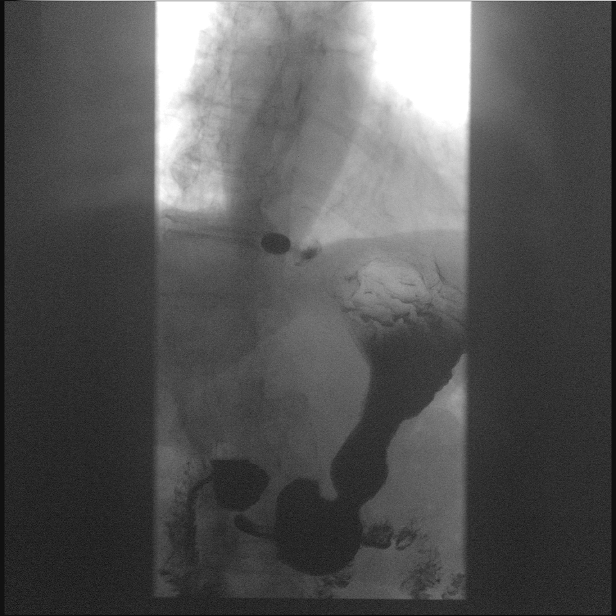

[14 of 24 positions shown; findings below may reference images not displayed]

PROCEDURE:
Normal pharyngeal anatomy and motility. No laryngeal penetration or
tracheal aspiration. Contrast flowed freely through the esophagus
without evidence of a stricture or mass. Normal esophageal mucosa
without evidence of irregularity or ulceration. Esophageal
dysmotility is seen throughout the entire esophagus with associated
tertiary contractions. No evidence of reflux. A small hiatal hernia
was seen.

A 13 mm barium tablet was administered which transited through the
esophagus and esophagogastric junction without delay.

COMPLICATIONS:
NONE.
IMPRESSION: 1.  Normal pharyngeal anatomy and motility.

2.  No evidence of esophageal stricture.

3. Esophageal dysmotility is seen throughout the entire esophagus
with associated tertiary contractions.

4.  Small hiatal hernia is seen.

5.  No evidence of gastroesophageal reflux seen.

This exam was performed by Eshaan Able, and was supervised
and interpreted by Dr. Ahmed Awad.
